# Patient Record
Sex: Male | Born: 1955
Health system: Southern US, Community
[De-identification: ages and names within clinical notes are randomized; demographics above are authoritative.]

## PROBLEM LIST (undated history)

## (undated) DIAGNOSIS — I10 Essential (primary) hypertension: Secondary | ICD-10-CM

## (undated) DIAGNOSIS — E785 Hyperlipidemia, unspecified: Secondary | ICD-10-CM

## (undated) HISTORY — PX: ANKLE SURGERY: SHX546

## (undated) HISTORY — PX: BACK SURGERY: SHX140

## (undated) HISTORY — PX: TOTAL KNEE ARTHROPLASTY: SHX125

---

## 1998-04-25 ENCOUNTER — Inpatient Hospital Stay (HOSPITAL_COMMUNITY): Admission: EM | Admit: 1998-04-25 | Discharge: 1998-05-03 | Payer: Self-pay | Admitting: Psychiatry

## 2001-09-29 ENCOUNTER — Ambulatory Visit (HOSPITAL_BASED_OUTPATIENT_CLINIC_OR_DEPARTMENT_OTHER): Admission: RE | Admit: 2001-09-29 | Discharge: 2001-09-29 | Payer: Self-pay | Admitting: Orthopedic Surgery

## 2003-04-12 ENCOUNTER — Ambulatory Visit (HOSPITAL_BASED_OUTPATIENT_CLINIC_OR_DEPARTMENT_OTHER): Admission: RE | Admit: 2003-04-12 | Discharge: 2003-04-12 | Payer: Self-pay | Admitting: Orthopedic Surgery

## 2003-04-12 ENCOUNTER — Ambulatory Visit (HOSPITAL_COMMUNITY): Admission: RE | Admit: 2003-04-12 | Discharge: 2003-04-12 | Payer: Self-pay | Admitting: Orthopedic Surgery

## 2005-03-05 ENCOUNTER — Inpatient Hospital Stay (HOSPITAL_COMMUNITY): Admission: RE | Admit: 2005-03-05 | Discharge: 2005-03-08 | Payer: Self-pay | Admitting: Orthopedic Surgery

## 2005-04-25 ENCOUNTER — Ambulatory Visit (HOSPITAL_BASED_OUTPATIENT_CLINIC_OR_DEPARTMENT_OTHER): Admission: RE | Admit: 2005-04-25 | Discharge: 2005-04-25 | Payer: Self-pay | Admitting: Orthopedic Surgery

## 2006-03-05 ENCOUNTER — Encounter
Admission: RE | Admit: 2006-03-05 | Discharge: 2006-06-03 | Payer: Self-pay | Admitting: Physical Medicine and Rehabilitation

## 2006-03-05 ENCOUNTER — Ambulatory Visit: Payer: Self-pay | Admitting: Physical Medicine and Rehabilitation

## 2006-04-08 ENCOUNTER — Ambulatory Visit (HOSPITAL_COMMUNITY)
Admission: RE | Admit: 2006-04-08 | Discharge: 2006-04-08 | Payer: Self-pay | Admitting: Physical Medicine and Rehabilitation

## 2011-05-14 DIAGNOSIS — G894 Chronic pain syndrome: Secondary | ICD-10-CM | POA: Diagnosis not present

## 2011-05-14 DIAGNOSIS — N529 Male erectile dysfunction, unspecified: Secondary | ICD-10-CM | POA: Diagnosis not present

## 2011-05-14 DIAGNOSIS — M545 Low back pain, unspecified: Secondary | ICD-10-CM | POA: Diagnosis not present

## 2011-06-03 ENCOUNTER — Emergency Department (HOSPITAL_BASED_OUTPATIENT_CLINIC_OR_DEPARTMENT_OTHER)
Admission: EM | Admit: 2011-06-03 | Discharge: 2011-06-03 | Discharge status: Against Medical Advice | Payer: Medicare Other | Attending: Emergency Medicine | Admitting: Emergency Medicine

## 2011-06-03 ENCOUNTER — Encounter (HOSPITAL_BASED_OUTPATIENT_CLINIC_OR_DEPARTMENT_OTHER): Payer: Self-pay

## 2011-06-03 DIAGNOSIS — M25569 Pain in unspecified knee: Secondary | ICD-10-CM | POA: Diagnosis not present

## 2011-06-03 DIAGNOSIS — M1710 Unilateral primary osteoarthritis, unspecified knee: Secondary | ICD-10-CM | POA: Diagnosis not present

## 2011-06-03 DIAGNOSIS — M25869 Other specified joint disorders, unspecified knee: Secondary | ICD-10-CM | POA: Diagnosis not present

## 2011-06-03 HISTORY — DX: Hyperlipidemia, unspecified: E78.5

## 2011-06-03 HISTORY — DX: Essential (primary) hypertension: I10

## 2011-06-03 NOTE — ED Notes (Signed)
Pt states his knee is feeling better and he is going to follow up with his doctor. K. Sophia, PA advised.

## 2011-06-03 NOTE — ED Notes (Signed)
Pt states that his left knee began hurting this morning.  Pt states that he has had a R knee replacement in the past.  Dr Valentina Gu is his orthopedist.  Pt states that he takes oxycodone and opana for pain.

## 2011-06-06 DIAGNOSIS — M7650 Patellar tendinitis, unspecified knee: Secondary | ICD-10-CM | POA: Diagnosis not present

## 2011-06-06 DIAGNOSIS — M12269 Villonodular synovitis (pigmented), unspecified knee: Secondary | ICD-10-CM | POA: Diagnosis not present

## 2011-06-14 DIAGNOSIS — G894 Chronic pain syndrome: Secondary | ICD-10-CM | POA: Diagnosis not present

## 2011-06-14 DIAGNOSIS — M899 Disorder of bone, unspecified: Secondary | ICD-10-CM | POA: Diagnosis not present

## 2011-06-14 DIAGNOSIS — I10 Essential (primary) hypertension: Secondary | ICD-10-CM | POA: Diagnosis not present

## 2011-06-14 DIAGNOSIS — E291 Testicular hypofunction: Secondary | ICD-10-CM | POA: Diagnosis not present

## 2011-06-14 DIAGNOSIS — Z79899 Other long term (current) drug therapy: Secondary | ICD-10-CM | POA: Diagnosis not present

## 2011-06-14 DIAGNOSIS — M949 Disorder of cartilage, unspecified: Secondary | ICD-10-CM | POA: Diagnosis not present

## 2011-07-10 DIAGNOSIS — M12269 Villonodular synovitis (pigmented), unspecified knee: Secondary | ICD-10-CM | POA: Diagnosis not present

## 2011-07-11 DIAGNOSIS — R0602 Shortness of breath: Secondary | ICD-10-CM | POA: Diagnosis not present

## 2011-07-11 DIAGNOSIS — R1084 Generalized abdominal pain: Secondary | ICD-10-CM | POA: Diagnosis not present

## 2011-07-11 DIAGNOSIS — R1110 Vomiting, unspecified: Secondary | ICD-10-CM | POA: Diagnosis not present

## 2011-07-11 DIAGNOSIS — Z86010 Personal history of colonic polyps: Secondary | ICD-10-CM | POA: Diagnosis not present

## 2011-07-11 DIAGNOSIS — I10 Essential (primary) hypertension: Secondary | ICD-10-CM | POA: Diagnosis not present

## 2011-07-11 DIAGNOSIS — R82998 Other abnormal findings in urine: Secondary | ICD-10-CM | POA: Diagnosis not present

## 2011-07-11 DIAGNOSIS — R1011 Right upper quadrant pain: Secondary | ICD-10-CM | POA: Diagnosis not present

## 2011-07-11 DIAGNOSIS — I498 Other specified cardiac arrhythmias: Secondary | ICD-10-CM | POA: Diagnosis not present

## 2011-07-11 DIAGNOSIS — K2990 Gastroduodenitis, unspecified, without bleeding: Secondary | ICD-10-CM | POA: Diagnosis not present

## 2011-07-11 DIAGNOSIS — K5730 Diverticulosis of large intestine without perforation or abscess without bleeding: Secondary | ICD-10-CM | POA: Diagnosis not present

## 2011-07-11 DIAGNOSIS — R112 Nausea with vomiting, unspecified: Secondary | ICD-10-CM | POA: Diagnosis not present

## 2011-07-11 DIAGNOSIS — R109 Unspecified abdominal pain: Secondary | ICD-10-CM | POA: Diagnosis not present

## 2011-07-12 DIAGNOSIS — E291 Testicular hypofunction: Secondary | ICD-10-CM | POA: Diagnosis not present

## 2011-07-12 DIAGNOSIS — R1110 Vomiting, unspecified: Secondary | ICD-10-CM | POA: Diagnosis not present

## 2011-07-12 DIAGNOSIS — R109 Unspecified abdominal pain: Secondary | ICD-10-CM | POA: Diagnosis not present

## 2011-07-12 DIAGNOSIS — R112 Nausea with vomiting, unspecified: Secondary | ICD-10-CM | POA: Diagnosis not present

## 2011-07-12 DIAGNOSIS — R1011 Right upper quadrant pain: Secondary | ICD-10-CM | POA: Diagnosis not present

## 2011-07-12 DIAGNOSIS — K5730 Diverticulosis of large intestine without perforation or abscess without bleeding: Secondary | ICD-10-CM | POA: Diagnosis not present

## 2011-07-12 DIAGNOSIS — E039 Hypothyroidism, unspecified: Secondary | ICD-10-CM | POA: Diagnosis not present

## 2011-07-12 DIAGNOSIS — R110 Nausea: Secondary | ICD-10-CM | POA: Diagnosis not present

## 2011-07-12 DIAGNOSIS — G894 Chronic pain syndrome: Secondary | ICD-10-CM | POA: Diagnosis not present

## 2011-08-09 DIAGNOSIS — G894 Chronic pain syndrome: Secondary | ICD-10-CM | POA: Diagnosis not present

## 2011-08-09 DIAGNOSIS — E291 Testicular hypofunction: Secondary | ICD-10-CM | POA: Diagnosis not present

## 2011-08-09 DIAGNOSIS — M109 Gout, unspecified: Secondary | ICD-10-CM | POA: Diagnosis not present

## 2011-08-09 DIAGNOSIS — E559 Vitamin D deficiency, unspecified: Secondary | ICD-10-CM | POA: Diagnosis not present

## 2011-08-09 DIAGNOSIS — E782 Mixed hyperlipidemia: Secondary | ICD-10-CM | POA: Diagnosis not present

## 2011-08-09 DIAGNOSIS — E039 Hypothyroidism, unspecified: Secondary | ICD-10-CM | POA: Diagnosis not present

## 2011-08-09 DIAGNOSIS — Z79899 Other long term (current) drug therapy: Secondary | ICD-10-CM | POA: Diagnosis not present

## 2011-09-06 DIAGNOSIS — E291 Testicular hypofunction: Secondary | ICD-10-CM | POA: Diagnosis not present

## 2011-09-06 DIAGNOSIS — G894 Chronic pain syndrome: Secondary | ICD-10-CM | POA: Diagnosis not present

## 2011-09-06 DIAGNOSIS — M109 Gout, unspecified: Secondary | ICD-10-CM | POA: Diagnosis not present

## 2011-09-06 DIAGNOSIS — I10 Essential (primary) hypertension: Secondary | ICD-10-CM | POA: Diagnosis not present

## 2011-09-10 DIAGNOSIS — K5900 Constipation, unspecified: Secondary | ICD-10-CM | POA: Diagnosis not present

## 2011-09-10 DIAGNOSIS — R1084 Generalized abdominal pain: Secondary | ICD-10-CM | POA: Diagnosis not present

## 2011-10-01 DIAGNOSIS — R1084 Generalized abdominal pain: Secondary | ICD-10-CM | POA: Diagnosis not present

## 2011-10-04 DIAGNOSIS — M109 Gout, unspecified: Secondary | ICD-10-CM | POA: Diagnosis not present

## 2011-10-04 DIAGNOSIS — M25519 Pain in unspecified shoulder: Secondary | ICD-10-CM | POA: Diagnosis not present

## 2011-10-04 DIAGNOSIS — IMO0002 Reserved for concepts with insufficient information to code with codable children: Secondary | ICD-10-CM | POA: Diagnosis not present

## 2011-10-04 DIAGNOSIS — G894 Chronic pain syndrome: Secondary | ICD-10-CM | POA: Diagnosis not present

## 2011-10-08 DIAGNOSIS — R1084 Generalized abdominal pain: Secondary | ICD-10-CM | POA: Diagnosis not present

## 2011-10-08 DIAGNOSIS — K219 Gastro-esophageal reflux disease without esophagitis: Secondary | ICD-10-CM | POA: Diagnosis not present

## 2011-10-08 DIAGNOSIS — K5900 Constipation, unspecified: Secondary | ICD-10-CM | POA: Diagnosis not present

## 2011-10-08 DIAGNOSIS — K2270 Barrett's esophagus without dysplasia: Secondary | ICD-10-CM | POA: Diagnosis not present

## 2011-11-02 DIAGNOSIS — I10 Essential (primary) hypertension: Secondary | ICD-10-CM | POA: Diagnosis not present

## 2011-11-02 DIAGNOSIS — Z0000 Encounter for general adult medical examination without abnormal findings: Secondary | ICD-10-CM | POA: Diagnosis not present

## 2011-11-02 DIAGNOSIS — G894 Chronic pain syndrome: Secondary | ICD-10-CM | POA: Diagnosis not present

## 2011-11-30 DIAGNOSIS — M109 Gout, unspecified: Secondary | ICD-10-CM | POA: Diagnosis not present

## 2011-11-30 DIAGNOSIS — G894 Chronic pain syndrome: Secondary | ICD-10-CM | POA: Diagnosis not present

## 2011-11-30 DIAGNOSIS — I10 Essential (primary) hypertension: Secondary | ICD-10-CM | POA: Diagnosis not present

## 2011-12-28 DIAGNOSIS — I10 Essential (primary) hypertension: Secondary | ICD-10-CM | POA: Diagnosis not present

## 2011-12-28 DIAGNOSIS — J309 Allergic rhinitis, unspecified: Secondary | ICD-10-CM | POA: Diagnosis not present

## 2011-12-28 DIAGNOSIS — R0602 Shortness of breath: Secondary | ICD-10-CM | POA: Diagnosis not present

## 2011-12-28 DIAGNOSIS — G894 Chronic pain syndrome: Secondary | ICD-10-CM | POA: Diagnosis not present

## 2012-01-17 DIAGNOSIS — M5137 Other intervertebral disc degeneration, lumbosacral region: Secondary | ICD-10-CM | POA: Diagnosis not present

## 2012-01-17 DIAGNOSIS — M25569 Pain in unspecified knee: Secondary | ICD-10-CM | POA: Diagnosis not present

## 2012-01-17 DIAGNOSIS — IMO0002 Reserved for concepts with insufficient information to code with codable children: Secondary | ICD-10-CM | POA: Diagnosis not present

## 2012-02-21 DIAGNOSIS — M19279 Secondary osteoarthritis, unspecified ankle and foot: Secondary | ICD-10-CM | POA: Diagnosis not present

## 2012-02-21 DIAGNOSIS — M24576 Contracture, unspecified foot: Secondary | ICD-10-CM | POA: Diagnosis not present

## 2012-02-21 DIAGNOSIS — M24573 Contracture, unspecified ankle: Secondary | ICD-10-CM | POA: Diagnosis not present

## 2012-02-21 DIAGNOSIS — M79609 Pain in unspecified limb: Secondary | ICD-10-CM | POA: Diagnosis not present

## 2012-02-22 DIAGNOSIS — M549 Dorsalgia, unspecified: Secondary | ICD-10-CM | POA: Diagnosis not present

## 2012-02-22 DIAGNOSIS — G894 Chronic pain syndrome: Secondary | ICD-10-CM | POA: Diagnosis not present

## 2012-02-22 DIAGNOSIS — E782 Mixed hyperlipidemia: Secondary | ICD-10-CM | POA: Diagnosis not present

## 2012-02-22 DIAGNOSIS — E039 Hypothyroidism, unspecified: Secondary | ICD-10-CM | POA: Diagnosis not present

## 2012-02-22 DIAGNOSIS — E559 Vitamin D deficiency, unspecified: Secondary | ICD-10-CM | POA: Diagnosis not present

## 2012-02-22 DIAGNOSIS — E291 Testicular hypofunction: Secondary | ICD-10-CM | POA: Diagnosis not present

## 2012-02-22 DIAGNOSIS — M109 Gout, unspecified: Secondary | ICD-10-CM | POA: Diagnosis not present

## 2012-04-18 DIAGNOSIS — E291 Testicular hypofunction: Secondary | ICD-10-CM | POA: Diagnosis not present

## 2012-04-18 DIAGNOSIS — G894 Chronic pain syndrome: Secondary | ICD-10-CM | POA: Diagnosis not present

## 2012-04-18 DIAGNOSIS — J309 Allergic rhinitis, unspecified: Secondary | ICD-10-CM | POA: Diagnosis not present

## 2012-04-18 DIAGNOSIS — E559 Vitamin D deficiency, unspecified: Secondary | ICD-10-CM | POA: Diagnosis not present

## 2012-05-14 DIAGNOSIS — I10 Essential (primary) hypertension: Secondary | ICD-10-CM | POA: Diagnosis not present

## 2012-05-14 DIAGNOSIS — E782 Mixed hyperlipidemia: Secondary | ICD-10-CM | POA: Diagnosis not present

## 2012-05-14 DIAGNOSIS — J069 Acute upper respiratory infection, unspecified: Secondary | ICD-10-CM | POA: Diagnosis not present

## 2012-05-14 DIAGNOSIS — R9431 Abnormal electrocardiogram [ECG] [EKG]: Secondary | ICD-10-CM | POA: Diagnosis not present

## 2012-05-16 DIAGNOSIS — R0602 Shortness of breath: Secondary | ICD-10-CM | POA: Diagnosis not present

## 2012-05-16 DIAGNOSIS — G894 Chronic pain syndrome: Secondary | ICD-10-CM | POA: Diagnosis not present

## 2012-05-16 DIAGNOSIS — J309 Allergic rhinitis, unspecified: Secondary | ICD-10-CM | POA: Diagnosis not present

## 2012-05-16 DIAGNOSIS — I10 Essential (primary) hypertension: Secondary | ICD-10-CM | POA: Diagnosis not present

## 2012-06-19 DIAGNOSIS — IMO0002 Reserved for concepts with insufficient information to code with codable children: Secondary | ICD-10-CM | POA: Diagnosis not present

## 2012-06-20 DIAGNOSIS — R0602 Shortness of breath: Secondary | ICD-10-CM | POA: Diagnosis not present

## 2012-06-20 DIAGNOSIS — R079 Chest pain, unspecified: Secondary | ICD-10-CM | POA: Diagnosis not present

## 2012-07-09 DIAGNOSIS — R109 Unspecified abdominal pain: Secondary | ICD-10-CM | POA: Diagnosis not present

## 2012-07-14 DIAGNOSIS — K219 Gastro-esophageal reflux disease without esophagitis: Secondary | ICD-10-CM | POA: Diagnosis not present

## 2012-07-16 DIAGNOSIS — E291 Testicular hypofunction: Secondary | ICD-10-CM | POA: Diagnosis not present

## 2012-07-16 DIAGNOSIS — M779 Enthesopathy, unspecified: Secondary | ICD-10-CM | POA: Diagnosis not present

## 2012-07-16 DIAGNOSIS — G894 Chronic pain syndrome: Secondary | ICD-10-CM | POA: Diagnosis not present

## 2012-07-18 DIAGNOSIS — M7710 Lateral epicondylitis, unspecified elbow: Secondary | ICD-10-CM | POA: Diagnosis not present

## 2012-08-05 DIAGNOSIS — R079 Chest pain, unspecified: Secondary | ICD-10-CM | POA: Diagnosis not present

## 2012-08-05 DIAGNOSIS — E782 Mixed hyperlipidemia: Secondary | ICD-10-CM | POA: Diagnosis not present

## 2012-08-05 DIAGNOSIS — I10 Essential (primary) hypertension: Secondary | ICD-10-CM | POA: Diagnosis not present

## 2012-08-05 DIAGNOSIS — R0602 Shortness of breath: Secondary | ICD-10-CM | POA: Diagnosis not present

## 2012-08-13 DIAGNOSIS — R079 Chest pain, unspecified: Secondary | ICD-10-CM | POA: Diagnosis not present

## 2012-08-13 DIAGNOSIS — R0602 Shortness of breath: Secondary | ICD-10-CM | POA: Diagnosis not present

## 2012-08-14 DIAGNOSIS — E291 Testicular hypofunction: Secondary | ICD-10-CM | POA: Diagnosis not present

## 2012-08-14 DIAGNOSIS — G894 Chronic pain syndrome: Secondary | ICD-10-CM | POA: Diagnosis not present

## 2012-08-14 DIAGNOSIS — M25569 Pain in unspecified knee: Secondary | ICD-10-CM | POA: Diagnosis not present

## 2012-08-14 DIAGNOSIS — M109 Gout, unspecified: Secondary | ICD-10-CM | POA: Diagnosis not present

## 2012-09-16 DIAGNOSIS — Z79899 Other long term (current) drug therapy: Secondary | ICD-10-CM | POA: Diagnosis not present

## 2012-09-16 DIAGNOSIS — I10 Essential (primary) hypertension: Secondary | ICD-10-CM | POA: Diagnosis not present

## 2012-09-16 DIAGNOSIS — IMO0002 Reserved for concepts with insufficient information to code with codable children: Secondary | ICD-10-CM | POA: Diagnosis not present

## 2012-09-16 DIAGNOSIS — G8929 Other chronic pain: Secondary | ICD-10-CM | POA: Diagnosis not present

## 2012-10-15 DIAGNOSIS — G8929 Other chronic pain: Secondary | ICD-10-CM | POA: Diagnosis not present

## 2012-10-15 DIAGNOSIS — I10 Essential (primary) hypertension: Secondary | ICD-10-CM | POA: Diagnosis not present

## 2012-10-15 DIAGNOSIS — E559 Vitamin D deficiency, unspecified: Secondary | ICD-10-CM | POA: Diagnosis not present

## 2012-11-13 DIAGNOSIS — E038 Other specified hypothyroidism: Secondary | ICD-10-CM | POA: Diagnosis not present

## 2012-11-13 DIAGNOSIS — Z79899 Other long term (current) drug therapy: Secondary | ICD-10-CM | POA: Diagnosis not present

## 2012-11-13 DIAGNOSIS — M109 Gout, unspecified: Secondary | ICD-10-CM | POA: Diagnosis not present

## 2012-11-13 DIAGNOSIS — E782 Mixed hyperlipidemia: Secondary | ICD-10-CM | POA: Diagnosis not present

## 2012-11-13 DIAGNOSIS — N529 Male erectile dysfunction, unspecified: Secondary | ICD-10-CM | POA: Diagnosis not present

## 2012-11-13 DIAGNOSIS — R7309 Other abnormal glucose: Secondary | ICD-10-CM | POA: Diagnosis not present

## 2012-11-13 DIAGNOSIS — G8929 Other chronic pain: Secondary | ICD-10-CM | POA: Diagnosis not present

## 2012-11-13 DIAGNOSIS — E559 Vitamin D deficiency, unspecified: Secondary | ICD-10-CM | POA: Diagnosis not present

## 2012-12-11 DIAGNOSIS — I10 Essential (primary) hypertension: Secondary | ICD-10-CM | POA: Diagnosis not present

## 2012-12-11 DIAGNOSIS — E782 Mixed hyperlipidemia: Secondary | ICD-10-CM | POA: Diagnosis not present

## 2012-12-11 DIAGNOSIS — M109 Gout, unspecified: Secondary | ICD-10-CM | POA: Diagnosis not present

## 2012-12-11 DIAGNOSIS — G8929 Other chronic pain: Secondary | ICD-10-CM | POA: Diagnosis not present

## 2012-12-11 DIAGNOSIS — E038 Other specified hypothyroidism: Secondary | ICD-10-CM | POA: Diagnosis not present

## 2012-12-24 DIAGNOSIS — Z86010 Personal history of colonic polyps: Secondary | ICD-10-CM | POA: Diagnosis not present

## 2012-12-24 DIAGNOSIS — R197 Diarrhea, unspecified: Secondary | ICD-10-CM | POA: Diagnosis not present

## 2013-01-08 DIAGNOSIS — G8929 Other chronic pain: Secondary | ICD-10-CM | POA: Diagnosis not present

## 2013-01-08 DIAGNOSIS — E291 Testicular hypofunction: Secondary | ICD-10-CM | POA: Diagnosis not present

## 2013-01-08 DIAGNOSIS — K219 Gastro-esophageal reflux disease without esophagitis: Secondary | ICD-10-CM | POA: Diagnosis not present

## 2013-01-08 DIAGNOSIS — I10 Essential (primary) hypertension: Secondary | ICD-10-CM | POA: Diagnosis not present

## 2013-01-08 DIAGNOSIS — Z6833 Body mass index (BMI) 33.0-33.9, adult: Secondary | ICD-10-CM | POA: Diagnosis not present

## 2013-01-28 DIAGNOSIS — Z86010 Personal history of colonic polyps: Secondary | ICD-10-CM | POA: Diagnosis not present

## 2013-01-28 DIAGNOSIS — R110 Nausea: Secondary | ICD-10-CM | POA: Diagnosis not present

## 2013-02-05 DIAGNOSIS — G8929 Other chronic pain: Secondary | ICD-10-CM | POA: Diagnosis not present

## 2013-02-05 DIAGNOSIS — I10 Essential (primary) hypertension: Secondary | ICD-10-CM | POA: Diagnosis not present

## 2013-02-05 DIAGNOSIS — M5380 Other specified dorsopathies, site unspecified: Secondary | ICD-10-CM | POA: Diagnosis not present

## 2013-02-25 DIAGNOSIS — IMO0002 Reserved for concepts with insufficient information to code with codable children: Secondary | ICD-10-CM | POA: Diagnosis not present

## 2013-02-25 DIAGNOSIS — T84069A Wear of articular bearing surface of unspecified internal prosthetic joint, initial encounter: Secondary | ICD-10-CM | POA: Diagnosis not present

## 2013-02-25 DIAGNOSIS — Z96659 Presence of unspecified artificial knee joint: Secondary | ICD-10-CM | POA: Diagnosis not present

## 2013-02-27 DIAGNOSIS — M47817 Spondylosis without myelopathy or radiculopathy, lumbosacral region: Secondary | ICD-10-CM | POA: Diagnosis not present

## 2013-02-27 DIAGNOSIS — M5137 Other intervertebral disc degeneration, lumbosacral region: Secondary | ICD-10-CM | POA: Diagnosis not present

## 2013-02-27 DIAGNOSIS — M545 Low back pain, unspecified: Secondary | ICD-10-CM | POA: Diagnosis not present

## 2013-02-27 DIAGNOSIS — M5126 Other intervertebral disc displacement, lumbar region: Secondary | ICD-10-CM | POA: Diagnosis not present

## 2013-03-06 DIAGNOSIS — E782 Mixed hyperlipidemia: Secondary | ICD-10-CM | POA: Diagnosis not present

## 2013-03-06 DIAGNOSIS — Z6832 Body mass index (BMI) 32.0-32.9, adult: Secondary | ICD-10-CM | POA: Diagnosis not present

## 2013-03-06 DIAGNOSIS — G8929 Other chronic pain: Secondary | ICD-10-CM | POA: Diagnosis not present

## 2013-03-06 DIAGNOSIS — I10 Essential (primary) hypertension: Secondary | ICD-10-CM | POA: Diagnosis not present

## 2013-04-02 DIAGNOSIS — Z96659 Presence of unspecified artificial knee joint: Secondary | ICD-10-CM | POA: Diagnosis not present

## 2013-04-02 DIAGNOSIS — T84069A Wear of articular bearing surface of unspecified internal prosthetic joint, initial encounter: Secondary | ICD-10-CM | POA: Diagnosis not present

## 2013-04-02 DIAGNOSIS — M5137 Other intervertebral disc degeneration, lumbosacral region: Secondary | ICD-10-CM | POA: Diagnosis not present

## 2013-04-02 DIAGNOSIS — IMO0002 Reserved for concepts with insufficient information to code with codable children: Secondary | ICD-10-CM | POA: Diagnosis not present

## 2013-04-06 DIAGNOSIS — M109 Gout, unspecified: Secondary | ICD-10-CM | POA: Diagnosis not present

## 2013-04-06 DIAGNOSIS — E782 Mixed hyperlipidemia: Secondary | ICD-10-CM | POA: Diagnosis not present

## 2013-04-06 DIAGNOSIS — G8929 Other chronic pain: Secondary | ICD-10-CM | POA: Diagnosis not present

## 2013-04-09 DIAGNOSIS — M545 Low back pain: Secondary | ICD-10-CM | POA: Diagnosis not present

## 2013-04-09 DIAGNOSIS — M961 Postlaminectomy syndrome, not elsewhere classified: Secondary | ICD-10-CM | POA: Diagnosis not present

## 2013-04-09 DIAGNOSIS — M5137 Other intervertebral disc degeneration, lumbosacral region: Secondary | ICD-10-CM | POA: Diagnosis not present

## 2013-04-15 DIAGNOSIS — M549 Dorsalgia, unspecified: Secondary | ICD-10-CM | POA: Diagnosis not present

## 2013-04-15 DIAGNOSIS — IMO0002 Reserved for concepts with insufficient information to code with codable children: Secondary | ICD-10-CM | POA: Diagnosis not present

## 2013-04-29 DIAGNOSIS — M25559 Pain in unspecified hip: Secondary | ICD-10-CM | POA: Diagnosis not present

## 2013-04-29 DIAGNOSIS — M5137 Other intervertebral disc degeneration, lumbosacral region: Secondary | ICD-10-CM | POA: Diagnosis not present

## 2013-04-29 DIAGNOSIS — M961 Postlaminectomy syndrome, not elsewhere classified: Secondary | ICD-10-CM | POA: Diagnosis not present

## 2013-04-29 DIAGNOSIS — M545 Low back pain, unspecified: Secondary | ICD-10-CM | POA: Diagnosis not present

## 2013-05-06 DIAGNOSIS — M109 Gout, unspecified: Secondary | ICD-10-CM | POA: Diagnosis not present

## 2013-05-06 DIAGNOSIS — I10 Essential (primary) hypertension: Secondary | ICD-10-CM | POA: Diagnosis not present

## 2013-05-06 DIAGNOSIS — G8929 Other chronic pain: Secondary | ICD-10-CM | POA: Diagnosis not present

## 2013-05-06 DIAGNOSIS — Z79899 Other long term (current) drug therapy: Secondary | ICD-10-CM | POA: Diagnosis not present

## 2013-05-06 DIAGNOSIS — E291 Testicular hypofunction: Secondary | ICD-10-CM | POA: Diagnosis not present

## 2013-05-06 DIAGNOSIS — E559 Vitamin D deficiency, unspecified: Secondary | ICD-10-CM | POA: Diagnosis not present

## 2013-05-06 DIAGNOSIS — E782 Mixed hyperlipidemia: Secondary | ICD-10-CM | POA: Diagnosis not present

## 2013-05-06 DIAGNOSIS — Z125 Encounter for screening for malignant neoplasm of prostate: Secondary | ICD-10-CM | POA: Diagnosis not present

## 2013-05-06 DIAGNOSIS — R7309 Other abnormal glucose: Secondary | ICD-10-CM | POA: Diagnosis not present

## 2013-05-07 DIAGNOSIS — M545 Low back pain, unspecified: Secondary | ICD-10-CM | POA: Diagnosis not present

## 2013-05-07 DIAGNOSIS — M5137 Other intervertebral disc degeneration, lumbosacral region: Secondary | ICD-10-CM | POA: Diagnosis not present

## 2013-05-07 DIAGNOSIS — M6281 Muscle weakness (generalized): Secondary | ICD-10-CM | POA: Diagnosis not present

## 2013-05-13 DIAGNOSIS — M545 Low back pain, unspecified: Secondary | ICD-10-CM | POA: Diagnosis not present

## 2013-05-13 DIAGNOSIS — M5137 Other intervertebral disc degeneration, lumbosacral region: Secondary | ICD-10-CM | POA: Diagnosis not present

## 2013-05-13 DIAGNOSIS — M6281 Muscle weakness (generalized): Secondary | ICD-10-CM | POA: Diagnosis not present

## 2013-05-14 DIAGNOSIS — M5137 Other intervertebral disc degeneration, lumbosacral region: Secondary | ICD-10-CM | POA: Diagnosis not present

## 2013-05-14 DIAGNOSIS — M6281 Muscle weakness (generalized): Secondary | ICD-10-CM | POA: Diagnosis not present

## 2013-05-14 DIAGNOSIS — M545 Low back pain, unspecified: Secondary | ICD-10-CM | POA: Diagnosis not present

## 2013-05-22 DIAGNOSIS — M5137 Other intervertebral disc degeneration, lumbosacral region: Secondary | ICD-10-CM | POA: Diagnosis not present

## 2013-05-22 DIAGNOSIS — M6281 Muscle weakness (generalized): Secondary | ICD-10-CM | POA: Diagnosis not present

## 2013-05-22 DIAGNOSIS — M545 Low back pain, unspecified: Secondary | ICD-10-CM | POA: Diagnosis not present

## 2013-05-26 DIAGNOSIS — M6281 Muscle weakness (generalized): Secondary | ICD-10-CM | POA: Diagnosis not present

## 2013-05-26 DIAGNOSIS — M545 Low back pain, unspecified: Secondary | ICD-10-CM | POA: Diagnosis not present

## 2013-05-26 DIAGNOSIS — M5137 Other intervertebral disc degeneration, lumbosacral region: Secondary | ICD-10-CM | POA: Diagnosis not present

## 2013-06-02 DIAGNOSIS — M5137 Other intervertebral disc degeneration, lumbosacral region: Secondary | ICD-10-CM | POA: Diagnosis not present

## 2013-06-02 DIAGNOSIS — M545 Low back pain, unspecified: Secondary | ICD-10-CM | POA: Diagnosis not present

## 2013-06-02 DIAGNOSIS — M6281 Muscle weakness (generalized): Secondary | ICD-10-CM | POA: Diagnosis not present

## 2013-06-05 DIAGNOSIS — G8929 Other chronic pain: Secondary | ICD-10-CM | POA: Diagnosis not present

## 2013-06-05 DIAGNOSIS — M109 Gout, unspecified: Secondary | ICD-10-CM | POA: Diagnosis not present

## 2013-06-05 DIAGNOSIS — I10 Essential (primary) hypertension: Secondary | ICD-10-CM | POA: Diagnosis not present

## 2013-06-10 DIAGNOSIS — M545 Low back pain, unspecified: Secondary | ICD-10-CM | POA: Diagnosis not present

## 2013-06-10 DIAGNOSIS — M25559 Pain in unspecified hip: Secondary | ICD-10-CM | POA: Diagnosis not present

## 2013-06-10 DIAGNOSIS — M961 Postlaminectomy syndrome, not elsewhere classified: Secondary | ICD-10-CM | POA: Diagnosis not present

## 2013-06-10 DIAGNOSIS — M5137 Other intervertebral disc degeneration, lumbosacral region: Secondary | ICD-10-CM | POA: Diagnosis not present

## 2013-06-11 DIAGNOSIS — M5137 Other intervertebral disc degeneration, lumbosacral region: Secondary | ICD-10-CM | POA: Diagnosis not present

## 2013-06-11 DIAGNOSIS — M6281 Muscle weakness (generalized): Secondary | ICD-10-CM | POA: Diagnosis not present

## 2013-06-11 DIAGNOSIS — M545 Low back pain, unspecified: Secondary | ICD-10-CM | POA: Diagnosis not present

## 2013-06-16 DIAGNOSIS — M545 Low back pain, unspecified: Secondary | ICD-10-CM | POA: Diagnosis not present

## 2013-06-16 DIAGNOSIS — M6281 Muscle weakness (generalized): Secondary | ICD-10-CM | POA: Diagnosis not present

## 2013-06-16 DIAGNOSIS — M5137 Other intervertebral disc degeneration, lumbosacral region: Secondary | ICD-10-CM | POA: Diagnosis not present

## 2013-06-23 DIAGNOSIS — M6281 Muscle weakness (generalized): Secondary | ICD-10-CM | POA: Diagnosis not present

## 2013-06-23 DIAGNOSIS — M5137 Other intervertebral disc degeneration, lumbosacral region: Secondary | ICD-10-CM | POA: Diagnosis not present

## 2013-06-23 DIAGNOSIS — M545 Low back pain, unspecified: Secondary | ICD-10-CM | POA: Diagnosis not present

## 2013-07-01 DIAGNOSIS — M545 Low back pain, unspecified: Secondary | ICD-10-CM | POA: Diagnosis not present

## 2013-07-01 DIAGNOSIS — M5137 Other intervertebral disc degeneration, lumbosacral region: Secondary | ICD-10-CM | POA: Diagnosis not present

## 2013-07-01 DIAGNOSIS — M6281 Muscle weakness (generalized): Secondary | ICD-10-CM | POA: Diagnosis not present

## 2013-07-02 DIAGNOSIS — G8929 Other chronic pain: Secondary | ICD-10-CM | POA: Diagnosis not present

## 2013-07-02 DIAGNOSIS — I10 Essential (primary) hypertension: Secondary | ICD-10-CM | POA: Diagnosis not present

## 2013-07-09 DIAGNOSIS — M545 Low back pain, unspecified: Secondary | ICD-10-CM | POA: Diagnosis not present

## 2013-07-09 DIAGNOSIS — M5137 Other intervertebral disc degeneration, lumbosacral region: Secondary | ICD-10-CM | POA: Diagnosis not present

## 2013-07-09 DIAGNOSIS — M6281 Muscle weakness (generalized): Secondary | ICD-10-CM | POA: Diagnosis not present

## 2013-07-14 DIAGNOSIS — M545 Low back pain, unspecified: Secondary | ICD-10-CM | POA: Diagnosis not present

## 2013-07-14 DIAGNOSIS — M6281 Muscle weakness (generalized): Secondary | ICD-10-CM | POA: Diagnosis not present

## 2013-07-14 DIAGNOSIS — M5137 Other intervertebral disc degeneration, lumbosacral region: Secondary | ICD-10-CM | POA: Diagnosis not present

## 2013-08-03 DIAGNOSIS — G8929 Other chronic pain: Secondary | ICD-10-CM | POA: Diagnosis not present

## 2013-08-03 DIAGNOSIS — I10 Essential (primary) hypertension: Secondary | ICD-10-CM | POA: Diagnosis not present

## 2013-09-01 DIAGNOSIS — I10 Essential (primary) hypertension: Secondary | ICD-10-CM | POA: Diagnosis not present

## 2013-09-01 DIAGNOSIS — E782 Mixed hyperlipidemia: Secondary | ICD-10-CM | POA: Diagnosis not present

## 2013-09-01 DIAGNOSIS — G8929 Other chronic pain: Secondary | ICD-10-CM | POA: Diagnosis not present

## 2013-09-01 DIAGNOSIS — H1010 Acute atopic conjunctivitis, unspecified eye: Secondary | ICD-10-CM | POA: Diagnosis not present

## 2013-10-02 DIAGNOSIS — Z7251 High risk heterosexual behavior: Secondary | ICD-10-CM | POA: Diagnosis not present

## 2013-10-02 DIAGNOSIS — E291 Testicular hypofunction: Secondary | ICD-10-CM | POA: Diagnosis not present

## 2013-10-02 DIAGNOSIS — E559 Vitamin D deficiency, unspecified: Secondary | ICD-10-CM | POA: Diagnosis not present

## 2013-10-02 DIAGNOSIS — K2270 Barrett's esophagus without dysplasia: Secondary | ICD-10-CM | POA: Diagnosis not present

## 2013-10-02 DIAGNOSIS — R7309 Other abnormal glucose: Secondary | ICD-10-CM | POA: Diagnosis not present

## 2013-10-02 DIAGNOSIS — M109 Gout, unspecified: Secondary | ICD-10-CM | POA: Diagnosis not present

## 2013-10-02 DIAGNOSIS — G8929 Other chronic pain: Secondary | ICD-10-CM | POA: Diagnosis not present

## 2013-10-02 DIAGNOSIS — Z79899 Other long term (current) drug therapy: Secondary | ICD-10-CM | POA: Diagnosis not present

## 2013-10-02 DIAGNOSIS — E782 Mixed hyperlipidemia: Secondary | ICD-10-CM | POA: Diagnosis not present

## 2013-11-02 DIAGNOSIS — E559 Vitamin D deficiency, unspecified: Secondary | ICD-10-CM | POA: Diagnosis not present

## 2013-11-02 DIAGNOSIS — M109 Gout, unspecified: Secondary | ICD-10-CM | POA: Diagnosis not present

## 2013-11-02 DIAGNOSIS — G8929 Other chronic pain: Secondary | ICD-10-CM | POA: Diagnosis not present

## 2013-11-02 DIAGNOSIS — B009 Herpesviral infection, unspecified: Secondary | ICD-10-CM | POA: Diagnosis not present

## 2013-11-02 DIAGNOSIS — E291 Testicular hypofunction: Secondary | ICD-10-CM | POA: Diagnosis not present

## 2013-12-02 DIAGNOSIS — I10 Essential (primary) hypertension: Secondary | ICD-10-CM | POA: Diagnosis not present

## 2013-12-02 DIAGNOSIS — G8929 Other chronic pain: Secondary | ICD-10-CM | POA: Diagnosis not present

## 2013-12-02 DIAGNOSIS — IMO0002 Reserved for concepts with insufficient information to code with codable children: Secondary | ICD-10-CM | POA: Diagnosis not present

## 2013-12-15 DIAGNOSIS — R109 Unspecified abdominal pain: Secondary | ICD-10-CM | POA: Diagnosis not present

## 2013-12-15 DIAGNOSIS — K2270 Barrett's esophagus without dysplasia: Secondary | ICD-10-CM | POA: Diagnosis not present

## 2013-12-15 DIAGNOSIS — K5900 Constipation, unspecified: Secondary | ICD-10-CM | POA: Diagnosis not present

## 2013-12-15 DIAGNOSIS — Z86010 Personal history of colonic polyps: Secondary | ICD-10-CM | POA: Diagnosis not present

## 2014-01-07 DIAGNOSIS — E291 Testicular hypofunction: Secondary | ICD-10-CM | POA: Diagnosis not present

## 2014-01-07 DIAGNOSIS — N979 Female infertility, unspecified: Secondary | ICD-10-CM | POA: Diagnosis not present

## 2014-01-07 DIAGNOSIS — E559 Vitamin D deficiency, unspecified: Secondary | ICD-10-CM | POA: Diagnosis not present

## 2014-01-07 DIAGNOSIS — I10 Essential (primary) hypertension: Secondary | ICD-10-CM | POA: Diagnosis not present

## 2014-01-07 DIAGNOSIS — E782 Mixed hyperlipidemia: Secondary | ICD-10-CM | POA: Diagnosis not present

## 2014-01-07 DIAGNOSIS — G894 Chronic pain syndrome: Secondary | ICD-10-CM | POA: Diagnosis not present

## 2014-01-14 DIAGNOSIS — K2270 Barrett's esophagus without dysplasia: Secondary | ICD-10-CM | POA: Diagnosis not present

## 2014-01-14 DIAGNOSIS — D126 Benign neoplasm of colon, unspecified: Secondary | ICD-10-CM | POA: Diagnosis not present

## 2014-01-14 DIAGNOSIS — Z01818 Encounter for other preprocedural examination: Secondary | ICD-10-CM | POA: Diagnosis not present

## 2014-01-14 DIAGNOSIS — A048 Other specified bacterial intestinal infections: Secondary | ICD-10-CM | POA: Diagnosis not present

## 2014-01-18 DIAGNOSIS — D126 Benign neoplasm of colon, unspecified: Secondary | ICD-10-CM | POA: Diagnosis not present

## 2014-01-19 DIAGNOSIS — K2940 Chronic atrophic gastritis without bleeding: Secondary | ICD-10-CM | POA: Diagnosis not present

## 2014-01-19 DIAGNOSIS — K2270 Barrett's esophagus without dysplasia: Secondary | ICD-10-CM | POA: Diagnosis not present

## 2014-01-19 DIAGNOSIS — K5281 Eosinophilic gastritis or gastroenteritis: Secondary | ICD-10-CM | POA: Diagnosis not present

## 2014-01-19 DIAGNOSIS — A048 Other specified bacterial intestinal infections: Secondary | ICD-10-CM | POA: Diagnosis not present

## 2014-01-20 DIAGNOSIS — K200 Eosinophilic esophagitis: Secondary | ICD-10-CM | POA: Diagnosis not present

## 2014-01-21 DIAGNOSIS — K219 Gastro-esophageal reflux disease without esophagitis: Secondary | ICD-10-CM | POA: Diagnosis not present

## 2014-01-21 DIAGNOSIS — K200 Eosinophilic esophagitis: Secondary | ICD-10-CM | POA: Diagnosis not present

## 2014-02-03 DIAGNOSIS — E782 Mixed hyperlipidemia: Secondary | ICD-10-CM | POA: Diagnosis not present

## 2014-02-03 DIAGNOSIS — E291 Testicular hypofunction: Secondary | ICD-10-CM | POA: Diagnosis not present

## 2014-02-03 DIAGNOSIS — R739 Hyperglycemia, unspecified: Secondary | ICD-10-CM | POA: Diagnosis not present

## 2014-02-03 DIAGNOSIS — E559 Vitamin D deficiency, unspecified: Secondary | ICD-10-CM | POA: Diagnosis not present

## 2014-02-03 DIAGNOSIS — M1A20X1 Drug-induced chronic gout, unspecified site, with tophus (tophi): Secondary | ICD-10-CM | POA: Diagnosis not present

## 2014-02-03 DIAGNOSIS — G894 Chronic pain syndrome: Secondary | ICD-10-CM | POA: Diagnosis not present

## 2014-02-03 DIAGNOSIS — K2270 Barrett's esophagus without dysplasia: Secondary | ICD-10-CM | POA: Diagnosis not present

## 2014-02-03 DIAGNOSIS — Z79899 Other long term (current) drug therapy: Secondary | ICD-10-CM | POA: Diagnosis not present

## 2014-02-03 DIAGNOSIS — K5909 Other constipation: Secondary | ICD-10-CM | POA: Diagnosis not present

## 2014-02-03 DIAGNOSIS — K219 Gastro-esophageal reflux disease without esophagitis: Secondary | ICD-10-CM | POA: Diagnosis not present

## 2014-02-03 DIAGNOSIS — Z23 Encounter for immunization: Secondary | ICD-10-CM | POA: Diagnosis not present

## 2014-02-03 DIAGNOSIS — E039 Hypothyroidism, unspecified: Secondary | ICD-10-CM | POA: Diagnosis not present

## 2014-02-03 DIAGNOSIS — K200 Eosinophilic esophagitis: Secondary | ICD-10-CM | POA: Diagnosis not present

## 2014-03-05 DIAGNOSIS — E559 Vitamin D deficiency, unspecified: Secondary | ICD-10-CM | POA: Diagnosis not present

## 2014-03-05 DIAGNOSIS — J309 Allergic rhinitis, unspecified: Secondary | ICD-10-CM | POA: Diagnosis not present

## 2014-03-05 DIAGNOSIS — M1A20X1 Drug-induced chronic gout, unspecified site, with tophus (tophi): Secondary | ICD-10-CM | POA: Diagnosis not present

## 2014-03-05 DIAGNOSIS — E039 Hypothyroidism, unspecified: Secondary | ICD-10-CM | POA: Diagnosis not present

## 2014-03-05 DIAGNOSIS — E291 Testicular hypofunction: Secondary | ICD-10-CM | POA: Diagnosis not present

## 2014-03-05 DIAGNOSIS — Z79899 Other long term (current) drug therapy: Secondary | ICD-10-CM | POA: Diagnosis not present

## 2014-03-05 DIAGNOSIS — M5137 Other intervertebral disc degeneration, lumbosacral region: Secondary | ICD-10-CM | POA: Diagnosis not present

## 2014-03-05 DIAGNOSIS — I10 Essential (primary) hypertension: Secondary | ICD-10-CM | POA: Diagnosis not present

## 2014-03-26 DIAGNOSIS — T402X5A Adverse effect of other opioids, initial encounter: Secondary | ICD-10-CM | POA: Diagnosis not present

## 2014-03-26 DIAGNOSIS — K5909 Other constipation: Secondary | ICD-10-CM | POA: Diagnosis not present

## 2014-03-26 DIAGNOSIS — K200 Eosinophilic esophagitis: Secondary | ICD-10-CM | POA: Diagnosis not present

## 2014-04-02 DIAGNOSIS — M5137 Other intervertebral disc degeneration, lumbosacral region: Secondary | ICD-10-CM | POA: Diagnosis not present

## 2014-04-02 DIAGNOSIS — Z6833 Body mass index (BMI) 33.0-33.9, adult: Secondary | ICD-10-CM | POA: Diagnosis not present

## 2014-04-02 DIAGNOSIS — Z79899 Other long term (current) drug therapy: Secondary | ICD-10-CM | POA: Diagnosis not present

## 2014-04-02 DIAGNOSIS — I10 Essential (primary) hypertension: Secondary | ICD-10-CM | POA: Diagnosis not present

## 2014-04-29 DIAGNOSIS — E559 Vitamin D deficiency, unspecified: Secondary | ICD-10-CM | POA: Diagnosis not present

## 2014-04-29 DIAGNOSIS — Z79899 Other long term (current) drug therapy: Secondary | ICD-10-CM | POA: Diagnosis not present

## 2014-04-29 DIAGNOSIS — E291 Testicular hypofunction: Secondary | ICD-10-CM | POA: Diagnosis not present

## 2014-04-29 DIAGNOSIS — E782 Mixed hyperlipidemia: Secondary | ICD-10-CM | POA: Diagnosis not present

## 2014-04-29 DIAGNOSIS — E039 Hypothyroidism, unspecified: Secondary | ICD-10-CM | POA: Diagnosis not present

## 2014-04-29 DIAGNOSIS — M1A20X1 Drug-induced chronic gout, unspecified site, with tophus (tophi): Secondary | ICD-10-CM | POA: Diagnosis not present

## 2014-04-29 DIAGNOSIS — R739 Hyperglycemia, unspecified: Secondary | ICD-10-CM | POA: Diagnosis not present

## 2014-04-29 DIAGNOSIS — M5137 Other intervertebral disc degeneration, lumbosacral region: Secondary | ICD-10-CM | POA: Diagnosis not present

## 2014-06-01 DIAGNOSIS — M1A20X1 Drug-induced chronic gout, unspecified site, with tophus (tophi): Secondary | ICD-10-CM | POA: Diagnosis not present

## 2014-06-01 DIAGNOSIS — Z79899 Other long term (current) drug therapy: Secondary | ICD-10-CM | POA: Diagnosis not present

## 2014-06-01 DIAGNOSIS — E291 Testicular hypofunction: Secondary | ICD-10-CM | POA: Diagnosis not present

## 2014-06-01 DIAGNOSIS — E782 Mixed hyperlipidemia: Secondary | ICD-10-CM | POA: Diagnosis not present

## 2014-06-01 DIAGNOSIS — E559 Vitamin D deficiency, unspecified: Secondary | ICD-10-CM | POA: Diagnosis not present

## 2014-06-01 DIAGNOSIS — E039 Hypothyroidism, unspecified: Secondary | ICD-10-CM | POA: Diagnosis not present

## 2014-06-01 DIAGNOSIS — G894 Chronic pain syndrome: Secondary | ICD-10-CM | POA: Diagnosis not present

## 2014-06-01 DIAGNOSIS — M5137 Other intervertebral disc degeneration, lumbosacral region: Secondary | ICD-10-CM | POA: Diagnosis not present

## 2014-06-29 DIAGNOSIS — K200 Eosinophilic esophagitis: Secondary | ICD-10-CM | POA: Diagnosis not present

## 2014-06-29 DIAGNOSIS — K219 Gastro-esophageal reflux disease without esophagitis: Secondary | ICD-10-CM | POA: Diagnosis not present

## 2014-06-29 DIAGNOSIS — R1013 Epigastric pain: Secondary | ICD-10-CM | POA: Diagnosis not present

## 2014-06-29 DIAGNOSIS — K2270 Barrett's esophagus without dysplasia: Secondary | ICD-10-CM | POA: Diagnosis not present

## 2014-06-30 DIAGNOSIS — K5900 Constipation, unspecified: Secondary | ICD-10-CM | POA: Diagnosis not present

## 2014-06-30 DIAGNOSIS — K219 Gastro-esophageal reflux disease without esophagitis: Secondary | ICD-10-CM | POA: Diagnosis not present

## 2014-06-30 DIAGNOSIS — M5137 Other intervertebral disc degeneration, lumbosacral region: Secondary | ICD-10-CM | POA: Diagnosis not present

## 2014-06-30 DIAGNOSIS — G894 Chronic pain syndrome: Secondary | ICD-10-CM | POA: Diagnosis not present

## 2014-06-30 DIAGNOSIS — Z6832 Body mass index (BMI) 32.0-32.9, adult: Secondary | ICD-10-CM | POA: Diagnosis not present

## 2014-06-30 DIAGNOSIS — E559 Vitamin D deficiency, unspecified: Secondary | ICD-10-CM | POA: Diagnosis not present

## 2014-07-27 DIAGNOSIS — Z01818 Encounter for other preprocedural examination: Secondary | ICD-10-CM | POA: Diagnosis not present

## 2014-07-27 DIAGNOSIS — A048 Other specified bacterial intestinal infections: Secondary | ICD-10-CM | POA: Diagnosis not present

## 2014-07-27 DIAGNOSIS — K200 Eosinophilic esophagitis: Secondary | ICD-10-CM | POA: Diagnosis not present

## 2014-08-02 DIAGNOSIS — K200 Eosinophilic esophagitis: Secondary | ICD-10-CM | POA: Diagnosis not present

## 2014-08-02 DIAGNOSIS — A048 Other specified bacterial intestinal infections: Secondary | ICD-10-CM | POA: Diagnosis not present

## 2014-08-02 DIAGNOSIS — K5281 Eosinophilic gastritis or gastroenteritis: Secondary | ICD-10-CM | POA: Diagnosis not present

## 2014-08-02 DIAGNOSIS — M5137 Other intervertebral disc degeneration, lumbosacral region: Secondary | ICD-10-CM | POA: Diagnosis not present

## 2014-08-02 DIAGNOSIS — E782 Mixed hyperlipidemia: Secondary | ICD-10-CM | POA: Diagnosis not present

## 2014-08-02 DIAGNOSIS — Z6833 Body mass index (BMI) 33.0-33.9, adult: Secondary | ICD-10-CM | POA: Diagnosis not present

## 2014-08-02 DIAGNOSIS — K2950 Unspecified chronic gastritis without bleeding: Secondary | ICD-10-CM | POA: Diagnosis not present

## 2014-08-02 DIAGNOSIS — Z79899 Other long term (current) drug therapy: Secondary | ICD-10-CM | POA: Diagnosis not present

## 2014-08-02 DIAGNOSIS — H1010 Acute atopic conjunctivitis, unspecified eye: Secondary | ICD-10-CM | POA: Diagnosis not present

## 2014-09-02 DIAGNOSIS — Z1389 Encounter for screening for other disorder: Secondary | ICD-10-CM | POA: Diagnosis not present

## 2014-09-02 DIAGNOSIS — E039 Hypothyroidism, unspecified: Secondary | ICD-10-CM | POA: Diagnosis not present

## 2014-09-02 DIAGNOSIS — G894 Chronic pain syndrome: Secondary | ICD-10-CM | POA: Diagnosis not present

## 2014-09-02 DIAGNOSIS — E559 Vitamin D deficiency, unspecified: Secondary | ICD-10-CM | POA: Diagnosis not present

## 2014-09-02 DIAGNOSIS — Z6832 Body mass index (BMI) 32.0-32.9, adult: Secondary | ICD-10-CM | POA: Diagnosis not present

## 2014-09-02 DIAGNOSIS — M1A20X1 Drug-induced chronic gout, unspecified site, with tophus (tophi): Secondary | ICD-10-CM | POA: Diagnosis not present

## 2014-09-02 DIAGNOSIS — Z79899 Other long term (current) drug therapy: Secondary | ICD-10-CM | POA: Diagnosis not present

## 2014-09-02 DIAGNOSIS — E291 Testicular hypofunction: Secondary | ICD-10-CM | POA: Diagnosis not present

## 2014-09-02 DIAGNOSIS — R739 Hyperglycemia, unspecified: Secondary | ICD-10-CM | POA: Diagnosis not present

## 2014-09-02 DIAGNOSIS — E782 Mixed hyperlipidemia: Secondary | ICD-10-CM | POA: Diagnosis not present

## 2014-09-29 DIAGNOSIS — Z6832 Body mass index (BMI) 32.0-32.9, adult: Secondary | ICD-10-CM | POA: Diagnosis not present

## 2014-09-29 DIAGNOSIS — E559 Vitamin D deficiency, unspecified: Secondary | ICD-10-CM | POA: Diagnosis not present

## 2014-09-29 DIAGNOSIS — Z1389 Encounter for screening for other disorder: Secondary | ICD-10-CM | POA: Diagnosis not present

## 2014-09-29 DIAGNOSIS — L299 Pruritus, unspecified: Secondary | ICD-10-CM | POA: Diagnosis not present

## 2014-09-29 DIAGNOSIS — G894 Chronic pain syndrome: Secondary | ICD-10-CM | POA: Diagnosis not present

## 2014-09-29 DIAGNOSIS — M5137 Other intervertebral disc degeneration, lumbosacral region: Secondary | ICD-10-CM | POA: Diagnosis not present

## 2014-09-29 DIAGNOSIS — Z79899 Other long term (current) drug therapy: Secondary | ICD-10-CM | POA: Diagnosis not present

## 2014-10-28 DIAGNOSIS — G629 Polyneuropathy, unspecified: Secondary | ICD-10-CM | POA: Diagnosis not present

## 2014-10-28 DIAGNOSIS — Z79899 Other long term (current) drug therapy: Secondary | ICD-10-CM | POA: Diagnosis not present

## 2014-10-28 DIAGNOSIS — M5137 Other intervertebral disc degeneration, lumbosacral region: Secondary | ICD-10-CM | POA: Diagnosis not present

## 2014-10-28 DIAGNOSIS — M62838 Other muscle spasm: Secondary | ICD-10-CM | POA: Diagnosis not present

## 2014-10-28 DIAGNOSIS — Z6832 Body mass index (BMI) 32.0-32.9, adult: Secondary | ICD-10-CM | POA: Diagnosis not present

## 2014-10-28 DIAGNOSIS — I10 Essential (primary) hypertension: Secondary | ICD-10-CM | POA: Diagnosis not present

## 2014-10-29 DIAGNOSIS — M5136 Other intervertebral disc degeneration, lumbar region: Secondary | ICD-10-CM | POA: Diagnosis not present

## 2014-10-29 DIAGNOSIS — M545 Low back pain: Secondary | ICD-10-CM | POA: Diagnosis not present

## 2014-10-29 DIAGNOSIS — Z6831 Body mass index (BMI) 31.0-31.9, adult: Secondary | ICD-10-CM | POA: Diagnosis not present

## 2014-10-29 DIAGNOSIS — M47896 Other spondylosis, lumbar region: Secondary | ICD-10-CM | POA: Diagnosis not present

## 2014-11-08 DIAGNOSIS — N281 Cyst of kidney, acquired: Secondary | ICD-10-CM | POA: Diagnosis not present

## 2014-11-08 DIAGNOSIS — M5136 Other intervertebral disc degeneration, lumbar region: Secondary | ICD-10-CM | POA: Diagnosis not present

## 2014-11-08 DIAGNOSIS — M1990 Unspecified osteoarthritis, unspecified site: Secondary | ICD-10-CM | POA: Diagnosis not present

## 2014-11-09 DIAGNOSIS — M2550 Pain in unspecified joint: Secondary | ICD-10-CM | POA: Diagnosis not present

## 2014-11-09 DIAGNOSIS — R072 Precordial pain: Secondary | ICD-10-CM | POA: Diagnosis not present

## 2014-11-09 DIAGNOSIS — M79606 Pain in leg, unspecified: Secondary | ICD-10-CM | POA: Diagnosis not present

## 2014-11-09 DIAGNOSIS — R0602 Shortness of breath: Secondary | ICD-10-CM | POA: Diagnosis not present

## 2014-11-29 DIAGNOSIS — E039 Hypothyroidism, unspecified: Secondary | ICD-10-CM | POA: Diagnosis not present

## 2014-11-29 DIAGNOSIS — R739 Hyperglycemia, unspecified: Secondary | ICD-10-CM | POA: Diagnosis not present

## 2014-11-29 DIAGNOSIS — E291 Testicular hypofunction: Secondary | ICD-10-CM | POA: Diagnosis not present

## 2014-11-29 DIAGNOSIS — Z79899 Other long term (current) drug therapy: Secondary | ICD-10-CM | POA: Diagnosis not present

## 2014-11-29 DIAGNOSIS — E782 Mixed hyperlipidemia: Secondary | ICD-10-CM | POA: Diagnosis not present

## 2014-11-29 DIAGNOSIS — E559 Vitamin D deficiency, unspecified: Secondary | ICD-10-CM | POA: Diagnosis not present

## 2014-11-29 DIAGNOSIS — M5137 Other intervertebral disc degeneration, lumbosacral region: Secondary | ICD-10-CM | POA: Diagnosis not present

## 2014-11-29 DIAGNOSIS — M1A20X1 Drug-induced chronic gout, unspecified site, with tophus (tophi): Secondary | ICD-10-CM | POA: Diagnosis not present

## 2014-12-03 DIAGNOSIS — M961 Postlaminectomy syndrome, not elsewhere classified: Secondary | ICD-10-CM | POA: Diagnosis not present

## 2014-12-03 DIAGNOSIS — M47896 Other spondylosis, lumbar region: Secondary | ICD-10-CM | POA: Diagnosis not present

## 2014-12-03 DIAGNOSIS — M5136 Other intervertebral disc degeneration, lumbar region: Secondary | ICD-10-CM | POA: Diagnosis not present

## 2014-12-29 DIAGNOSIS — Z79899 Other long term (current) drug therapy: Secondary | ICD-10-CM | POA: Diagnosis not present

## 2014-12-29 DIAGNOSIS — Z6831 Body mass index (BMI) 31.0-31.9, adult: Secondary | ICD-10-CM | POA: Diagnosis not present

## 2014-12-29 DIAGNOSIS — E782 Mixed hyperlipidemia: Secondary | ICD-10-CM | POA: Diagnosis not present

## 2014-12-29 DIAGNOSIS — E039 Hypothyroidism, unspecified: Secondary | ICD-10-CM | POA: Diagnosis not present

## 2014-12-29 DIAGNOSIS — G894 Chronic pain syndrome: Secondary | ICD-10-CM | POA: Diagnosis not present

## 2014-12-29 DIAGNOSIS — E559 Vitamin D deficiency, unspecified: Secondary | ICD-10-CM | POA: Diagnosis not present

## 2015-01-03 DIAGNOSIS — M19011 Primary osteoarthritis, right shoulder: Secondary | ICD-10-CM | POA: Diagnosis not present

## 2015-01-03 DIAGNOSIS — M7541 Impingement syndrome of right shoulder: Secondary | ICD-10-CM | POA: Diagnosis not present

## 2015-01-03 DIAGNOSIS — M19012 Primary osteoarthritis, left shoulder: Secondary | ICD-10-CM | POA: Diagnosis not present

## 2015-01-03 DIAGNOSIS — M7542 Impingement syndrome of left shoulder: Secondary | ICD-10-CM | POA: Diagnosis not present

## 2015-01-28 DIAGNOSIS — M5137 Other intervertebral disc degeneration, lumbosacral region: Secondary | ICD-10-CM | POA: Diagnosis not present

## 2015-01-28 DIAGNOSIS — E291 Testicular hypofunction: Secondary | ICD-10-CM | POA: Diagnosis not present

## 2015-01-28 DIAGNOSIS — G629 Polyneuropathy, unspecified: Secondary | ICD-10-CM | POA: Diagnosis not present

## 2015-01-28 DIAGNOSIS — Z6832 Body mass index (BMI) 32.0-32.9, adult: Secondary | ICD-10-CM | POA: Diagnosis not present

## 2015-01-28 DIAGNOSIS — Z79899 Other long term (current) drug therapy: Secondary | ICD-10-CM | POA: Diagnosis not present

## 2015-02-28 DIAGNOSIS — E782 Mixed hyperlipidemia: Secondary | ICD-10-CM | POA: Diagnosis not present

## 2015-02-28 DIAGNOSIS — Z6833 Body mass index (BMI) 33.0-33.9, adult: Secondary | ICD-10-CM | POA: Diagnosis not present

## 2015-02-28 DIAGNOSIS — M25561 Pain in right knee: Secondary | ICD-10-CM | POA: Diagnosis not present

## 2015-02-28 DIAGNOSIS — M5137 Other intervertebral disc degeneration, lumbosacral region: Secondary | ICD-10-CM | POA: Diagnosis not present

## 2015-02-28 DIAGNOSIS — G629 Polyneuropathy, unspecified: Secondary | ICD-10-CM | POA: Diagnosis not present

## 2015-02-28 DIAGNOSIS — Z79899 Other long term (current) drug therapy: Secondary | ICD-10-CM | POA: Diagnosis not present

## 2015-02-28 DIAGNOSIS — E663 Overweight: Secondary | ICD-10-CM | POA: Diagnosis not present

## 2015-05-25 DIAGNOSIS — M25569 Pain in unspecified knee: Secondary | ICD-10-CM | POA: Diagnosis not present

## 2015-05-25 DIAGNOSIS — M545 Low back pain: Secondary | ICD-10-CM | POA: Diagnosis not present

## 2015-05-25 DIAGNOSIS — G8929 Other chronic pain: Secondary | ICD-10-CM | POA: Diagnosis not present

## 2015-06-21 DIAGNOSIS — J301 Allergic rhinitis due to pollen: Secondary | ICD-10-CM | POA: Diagnosis not present

## 2015-06-21 DIAGNOSIS — H1013 Acute atopic conjunctivitis, bilateral: Secondary | ICD-10-CM | POA: Diagnosis not present

## 2015-06-21 DIAGNOSIS — M170 Bilateral primary osteoarthritis of knee: Secondary | ICD-10-CM | POA: Diagnosis not present

## 2015-06-21 DIAGNOSIS — M778 Other enthesopathies, not elsewhere classified: Secondary | ICD-10-CM | POA: Diagnosis not present

## 2015-06-21 DIAGNOSIS — J4530 Mild persistent asthma, uncomplicated: Secondary | ICD-10-CM | POA: Diagnosis not present

## 2015-06-21 DIAGNOSIS — M24576 Contracture, unspecified foot: Secondary | ICD-10-CM

## 2015-06-21 DIAGNOSIS — M5136 Other intervertebral disc degeneration, lumbar region: Secondary | ICD-10-CM

## 2015-06-21 DIAGNOSIS — M24573 Contracture, unspecified ankle: Secondary | ICD-10-CM

## 2015-06-21 DIAGNOSIS — E785 Hyperlipidemia, unspecified: Secondary | ICD-10-CM

## 2015-06-21 DIAGNOSIS — J309 Allergic rhinitis, unspecified: Secondary | ICD-10-CM

## 2015-06-21 DIAGNOSIS — G43009 Migraine without aura, not intractable, without status migrainosus: Secondary | ICD-10-CM | POA: Insufficient documentation

## 2015-06-21 DIAGNOSIS — G894 Chronic pain syndrome: Secondary | ICD-10-CM

## 2015-06-21 DIAGNOSIS — T84068A Wear of articular bearing surface of other internal prosthetic joint, initial encounter: Secondary | ICD-10-CM | POA: Insufficient documentation

## 2015-06-21 DIAGNOSIS — G2581 Restless legs syndrome: Secondary | ICD-10-CM | POA: Insufficient documentation

## 2015-06-21 DIAGNOSIS — M5416 Radiculopathy, lumbar region: Secondary | ICD-10-CM

## 2015-06-21 DIAGNOSIS — M542 Cervicalgia: Secondary | ICD-10-CM

## 2015-06-21 DIAGNOSIS — H547 Unspecified visual loss: Secondary | ICD-10-CM

## 2015-06-21 DIAGNOSIS — Z96659 Presence of unspecified artificial knee joint: Secondary | ICD-10-CM

## 2015-06-21 DIAGNOSIS — K279 Peptic ulcer, site unspecified, unspecified as acute or chronic, without hemorrhage or perforation: Secondary | ICD-10-CM | POA: Insufficient documentation

## 2015-06-21 DIAGNOSIS — M19279 Secondary osteoarthritis, unspecified ankle and foot: Secondary | ICD-10-CM | POA: Insufficient documentation

## 2015-06-21 DIAGNOSIS — M1990 Unspecified osteoarthritis, unspecified site: Secondary | ICD-10-CM

## 2015-06-21 DIAGNOSIS — M51369 Other intervertebral disc degeneration, lumbar region without mention of lumbar back pain or lower extremity pain: Secondary | ICD-10-CM

## 2015-06-21 HISTORY — DX: Cervicalgia: M54.2

## 2015-06-21 HISTORY — DX: Restless legs syndrome: G25.81

## 2015-06-21 HISTORY — DX: Radiculopathy, lumbar region: M54.16

## 2015-06-21 HISTORY — DX: Other intervertebral disc degeneration, lumbar region: M51.36

## 2015-06-21 HISTORY — DX: Peptic ulcer, site unspecified, unspecified as acute or chronic, without hemorrhage or perforation: K27.9

## 2015-06-21 HISTORY — DX: Contracture, unspecified ankle: M24.573

## 2015-06-21 HISTORY — DX: Acute atopic conjunctivitis, bilateral: H10.13

## 2015-06-21 HISTORY — DX: Other intervertebral disc degeneration, lumbar region without mention of lumbar back pain or lower extremity pain: M51.369

## 2015-06-21 HISTORY — DX: Wear of articular bearing surface of other internal prosthetic joint, initial encounter: T84.068A

## 2015-06-21 HISTORY — DX: Chronic pain syndrome: G89.4

## 2015-06-21 HISTORY — DX: Allergic rhinitis, unspecified: J30.9

## 2015-06-21 HISTORY — DX: Unspecified osteoarthritis, unspecified site: M19.90

## 2015-06-21 HISTORY — DX: Other enthesopathies, not elsewhere classified: M77.8

## 2015-06-21 HISTORY — DX: Wear of articular bearing surface of other internal prosthetic joint, initial encounter: Z96.659

## 2015-06-21 HISTORY — DX: Migraine without aura, not intractable, without status migrainosus: G43.009

## 2015-06-21 HISTORY — DX: Bilateral primary osteoarthritis of knee: M17.0

## 2015-06-21 HISTORY — DX: Contracture, unspecified foot: M24.576

## 2015-06-21 HISTORY — DX: Unspecified visual loss: H54.7

## 2015-06-21 HISTORY — DX: Hyperlipidemia, unspecified: E78.5

## 2015-06-21 HISTORY — DX: Secondary osteoarthritis, unspecified ankle and foot: M19.279

## 2015-06-22 DIAGNOSIS — G8929 Other chronic pain: Secondary | ICD-10-CM | POA: Diagnosis not present

## 2015-06-22 DIAGNOSIS — M1711 Unilateral primary osteoarthritis, right knee: Secondary | ICD-10-CM | POA: Diagnosis not present

## 2015-06-22 DIAGNOSIS — M545 Low back pain: Secondary | ICD-10-CM | POA: Diagnosis not present

## 2015-08-02 DIAGNOSIS — E559 Vitamin D deficiency, unspecified: Secondary | ICD-10-CM | POA: Diagnosis not present

## 2015-08-02 DIAGNOSIS — E782 Mixed hyperlipidemia: Secondary | ICD-10-CM | POA: Diagnosis not present

## 2015-08-02 DIAGNOSIS — G894 Chronic pain syndrome: Secondary | ICD-10-CM | POA: Diagnosis not present

## 2015-08-02 DIAGNOSIS — E291 Testicular hypofunction: Secondary | ICD-10-CM | POA: Diagnosis not present

## 2015-08-02 DIAGNOSIS — I10 Essential (primary) hypertension: Secondary | ICD-10-CM | POA: Diagnosis not present

## 2015-08-02 DIAGNOSIS — E034 Atrophy of thyroid (acquired): Secondary | ICD-10-CM | POA: Diagnosis not present

## 2015-08-02 DIAGNOSIS — R7989 Other specified abnormal findings of blood chemistry: Secondary | ICD-10-CM

## 2015-08-02 DIAGNOSIS — E039 Hypothyroidism, unspecified: Secondary | ICD-10-CM

## 2015-08-02 HISTORY — DX: Essential (primary) hypertension: I10

## 2015-08-02 HISTORY — DX: Vitamin D deficiency, unspecified: E55.9

## 2015-08-02 HISTORY — DX: Hypothyroidism, unspecified: E03.9

## 2015-08-02 HISTORY — DX: Mixed hyperlipidemia: E78.2

## 2015-08-02 HISTORY — DX: Other specified abnormal findings of blood chemistry: R79.89

## 2015-08-03 DIAGNOSIS — E291 Testicular hypofunction: Secondary | ICD-10-CM | POA: Diagnosis not present

## 2015-08-03 DIAGNOSIS — E039 Hypothyroidism, unspecified: Secondary | ICD-10-CM | POA: Diagnosis not present

## 2015-08-03 DIAGNOSIS — E559 Vitamin D deficiency, unspecified: Secondary | ICD-10-CM | POA: Diagnosis not present

## 2015-08-03 DIAGNOSIS — E782 Mixed hyperlipidemia: Secondary | ICD-10-CM | POA: Diagnosis not present

## 2015-08-03 DIAGNOSIS — I10 Essential (primary) hypertension: Secondary | ICD-10-CM | POA: Diagnosis not present

## 2015-09-13 DIAGNOSIS — M545 Low back pain: Secondary | ICD-10-CM | POA: Diagnosis not present

## 2015-09-13 DIAGNOSIS — G8929 Other chronic pain: Secondary | ICD-10-CM | POA: Diagnosis not present

## 2015-09-13 DIAGNOSIS — M542 Cervicalgia: Secondary | ICD-10-CM | POA: Diagnosis not present

## 2015-09-13 DIAGNOSIS — M1711 Unilateral primary osteoarthritis, right knee: Secondary | ICD-10-CM | POA: Diagnosis not present

## 2015-10-11 DIAGNOSIS — M1711 Unilateral primary osteoarthritis, right knee: Secondary | ICD-10-CM | POA: Diagnosis not present

## 2015-10-11 DIAGNOSIS — M542 Cervicalgia: Secondary | ICD-10-CM | POA: Diagnosis not present

## 2015-10-11 DIAGNOSIS — G8929 Other chronic pain: Secondary | ICD-10-CM | POA: Diagnosis not present

## 2015-10-11 DIAGNOSIS — M545 Low back pain: Secondary | ICD-10-CM | POA: Diagnosis not present

## 2015-11-03 DIAGNOSIS — Z96651 Presence of right artificial knee joint: Secondary | ICD-10-CM | POA: Diagnosis not present

## 2015-11-03 DIAGNOSIS — J4520 Mild intermittent asthma, uncomplicated: Secondary | ICD-10-CM | POA: Diagnosis not present

## 2015-11-03 DIAGNOSIS — M19072 Primary osteoarthritis, left ankle and foot: Secondary | ICD-10-CM | POA: Diagnosis not present

## 2015-11-03 DIAGNOSIS — M545 Low back pain: Secondary | ICD-10-CM | POA: Diagnosis not present

## 2015-11-03 DIAGNOSIS — I10 Essential (primary) hypertension: Secondary | ICD-10-CM | POA: Diagnosis not present

## 2015-11-03 DIAGNOSIS — M109 Gout, unspecified: Secondary | ICD-10-CM | POA: Diagnosis not present

## 2015-11-03 DIAGNOSIS — E039 Hypothyroidism, unspecified: Secondary | ICD-10-CM | POA: Diagnosis not present

## 2015-11-03 DIAGNOSIS — Z87828 Personal history of other (healed) physical injury and trauma: Secondary | ICD-10-CM | POA: Diagnosis not present

## 2015-11-04 DIAGNOSIS — Z87828 Personal history of other (healed) physical injury and trauma: Secondary | ICD-10-CM

## 2015-11-04 DIAGNOSIS — G8929 Other chronic pain: Secondary | ICD-10-CM

## 2015-11-04 DIAGNOSIS — M109 Gout, unspecified: Secondary | ICD-10-CM

## 2015-11-04 DIAGNOSIS — M19072 Primary osteoarthritis, left ankle and foot: Secondary | ICD-10-CM

## 2015-11-04 DIAGNOSIS — J4520 Mild intermittent asthma, uncomplicated: Secondary | ICD-10-CM | POA: Insufficient documentation

## 2015-11-04 DIAGNOSIS — M545 Low back pain, unspecified: Secondary | ICD-10-CM

## 2015-11-04 DIAGNOSIS — Z96651 Presence of right artificial knee joint: Secondary | ICD-10-CM | POA: Insufficient documentation

## 2015-11-04 HISTORY — DX: Presence of right artificial knee joint: Z96.651

## 2015-11-04 HISTORY — DX: Personal history of other (healed) physical injury and trauma: Z87.828

## 2015-11-04 HISTORY — DX: Gout, unspecified: M10.9

## 2015-11-04 HISTORY — DX: Other chronic pain: G89.29

## 2015-11-04 HISTORY — DX: Primary osteoarthritis, left ankle and foot: M19.072

## 2015-11-04 HISTORY — DX: Mild intermittent asthma, uncomplicated: J45.20

## 2015-11-04 HISTORY — DX: Low back pain, unspecified: M54.50

## 2016-01-31 DIAGNOSIS — M545 Low back pain: Secondary | ICD-10-CM | POA: Diagnosis not present

## 2016-01-31 DIAGNOSIS — M25572 Pain in left ankle and joints of left foot: Secondary | ICD-10-CM | POA: Diagnosis not present

## 2016-01-31 DIAGNOSIS — G8929 Other chronic pain: Secondary | ICD-10-CM | POA: Diagnosis not present

## 2016-01-31 DIAGNOSIS — M961 Postlaminectomy syndrome, not elsewhere classified: Secondary | ICD-10-CM | POA: Diagnosis not present

## 2016-02-22 DIAGNOSIS — I10 Essential (primary) hypertension: Secondary | ICD-10-CM | POA: Diagnosis not present

## 2016-02-22 DIAGNOSIS — G894 Chronic pain syndrome: Secondary | ICD-10-CM | POA: Diagnosis not present

## 2016-02-22 DIAGNOSIS — E785 Hyperlipidemia, unspecified: Secondary | ICD-10-CM | POA: Diagnosis not present

## 2016-02-22 DIAGNOSIS — Z720 Tobacco use: Secondary | ICD-10-CM | POA: Diagnosis not present

## 2016-02-22 DIAGNOSIS — E669 Obesity, unspecified: Secondary | ICD-10-CM | POA: Diagnosis not present

## 2016-02-22 DIAGNOSIS — E039 Hypothyroidism, unspecified: Secondary | ICD-10-CM | POA: Diagnosis not present

## 2016-02-22 DIAGNOSIS — Z125 Encounter for screening for malignant neoplasm of prostate: Secondary | ICD-10-CM | POA: Diagnosis not present

## 2016-02-29 DIAGNOSIS — Z79899 Other long term (current) drug therapy: Secondary | ICD-10-CM | POA: Diagnosis not present

## 2016-03-19 DIAGNOSIS — G894 Chronic pain syndrome: Secondary | ICD-10-CM | POA: Diagnosis not present

## 2016-03-19 DIAGNOSIS — E785 Hyperlipidemia, unspecified: Secondary | ICD-10-CM | POA: Diagnosis not present

## 2016-03-19 DIAGNOSIS — E669 Obesity, unspecified: Secondary | ICD-10-CM | POA: Diagnosis not present

## 2016-03-19 DIAGNOSIS — I10 Essential (primary) hypertension: Secondary | ICD-10-CM | POA: Diagnosis not present

## 2016-03-19 DIAGNOSIS — R972 Elevated prostate specific antigen [PSA]: Secondary | ICD-10-CM | POA: Diagnosis not present

## 2016-03-19 DIAGNOSIS — Z720 Tobacco use: Secondary | ICD-10-CM | POA: Diagnosis not present

## 2016-03-19 DIAGNOSIS — E039 Hypothyroidism, unspecified: Secondary | ICD-10-CM | POA: Diagnosis not present

## 2016-04-03 DIAGNOSIS — G8929 Other chronic pain: Secondary | ICD-10-CM | POA: Diagnosis not present

## 2016-04-03 DIAGNOSIS — M5441 Lumbago with sciatica, right side: Secondary | ICD-10-CM | POA: Diagnosis not present

## 2016-04-26 DIAGNOSIS — G8929 Other chronic pain: Secondary | ICD-10-CM | POA: Diagnosis not present

## 2016-04-26 DIAGNOSIS — M545 Low back pain: Secondary | ICD-10-CM | POA: Diagnosis not present

## 2016-04-26 DIAGNOSIS — M961 Postlaminectomy syndrome, not elsewhere classified: Secondary | ICD-10-CM | POA: Diagnosis not present

## 2016-04-26 DIAGNOSIS — M25562 Pain in left knee: Secondary | ICD-10-CM | POA: Diagnosis not present

## 2016-04-26 DIAGNOSIS — M25572 Pain in left ankle and joints of left foot: Secondary | ICD-10-CM | POA: Diagnosis not present

## 2016-04-27 DIAGNOSIS — Z720 Tobacco use: Secondary | ICD-10-CM | POA: Diagnosis not present

## 2016-04-27 DIAGNOSIS — R972 Elevated prostate specific antigen [PSA]: Secondary | ICD-10-CM | POA: Diagnosis not present

## 2016-04-27 DIAGNOSIS — I10 Essential (primary) hypertension: Secondary | ICD-10-CM | POA: Diagnosis not present

## 2016-04-27 DIAGNOSIS — G894 Chronic pain syndrome: Secondary | ICD-10-CM | POA: Diagnosis not present

## 2016-04-27 DIAGNOSIS — E785 Hyperlipidemia, unspecified: Secondary | ICD-10-CM | POA: Diagnosis not present

## 2016-04-27 DIAGNOSIS — E669 Obesity, unspecified: Secondary | ICD-10-CM | POA: Diagnosis not present

## 2016-04-27 DIAGNOSIS — E039 Hypothyroidism, unspecified: Secondary | ICD-10-CM | POA: Diagnosis not present

## 2016-05-21 DIAGNOSIS — M129 Arthropathy, unspecified: Secondary | ICD-10-CM | POA: Diagnosis not present

## 2016-05-21 DIAGNOSIS — E559 Vitamin D deficiency, unspecified: Secondary | ICD-10-CM | POA: Diagnosis not present

## 2016-05-21 DIAGNOSIS — M5441 Lumbago with sciatica, right side: Secondary | ICD-10-CM | POA: Diagnosis not present

## 2016-05-21 DIAGNOSIS — G8929 Other chronic pain: Secondary | ICD-10-CM | POA: Diagnosis not present

## 2016-05-21 DIAGNOSIS — E782 Mixed hyperlipidemia: Secondary | ICD-10-CM | POA: Diagnosis not present

## 2016-05-21 DIAGNOSIS — Z79899 Other long term (current) drug therapy: Secondary | ICD-10-CM | POA: Diagnosis not present

## 2016-05-29 DIAGNOSIS — M25551 Pain in right hip: Secondary | ICD-10-CM | POA: Diagnosis not present

## 2016-05-29 DIAGNOSIS — Z13820 Encounter for screening for osteoporosis: Secondary | ICD-10-CM | POA: Diagnosis not present

## 2016-05-29 DIAGNOSIS — R030 Elevated blood-pressure reading, without diagnosis of hypertension: Secondary | ICD-10-CM | POA: Diagnosis not present

## 2016-05-29 DIAGNOSIS — Z79899 Other long term (current) drug therapy: Secondary | ICD-10-CM | POA: Diagnosis not present

## 2016-05-29 DIAGNOSIS — M25552 Pain in left hip: Secondary | ICD-10-CM | POA: Diagnosis not present

## 2016-05-31 DIAGNOSIS — G8929 Other chronic pain: Secondary | ICD-10-CM | POA: Diagnosis not present

## 2016-05-31 DIAGNOSIS — M5441 Lumbago with sciatica, right side: Secondary | ICD-10-CM | POA: Diagnosis not present

## 2016-06-05 DIAGNOSIS — M25551 Pain in right hip: Secondary | ICD-10-CM | POA: Diagnosis not present

## 2016-06-05 DIAGNOSIS — Z79899 Other long term (current) drug therapy: Secondary | ICD-10-CM | POA: Diagnosis not present

## 2016-06-05 DIAGNOSIS — G8929 Other chronic pain: Secondary | ICD-10-CM | POA: Diagnosis not present

## 2016-06-05 DIAGNOSIS — M5441 Lumbago with sciatica, right side: Secondary | ICD-10-CM | POA: Diagnosis not present

## 2016-06-05 DIAGNOSIS — M25562 Pain in left knee: Secondary | ICD-10-CM | POA: Diagnosis not present

## 2016-06-05 DIAGNOSIS — M5136 Other intervertebral disc degeneration, lumbar region: Secondary | ICD-10-CM | POA: Diagnosis not present

## 2016-06-05 DIAGNOSIS — Z0000 Encounter for general adult medical examination without abnormal findings: Secondary | ICD-10-CM | POA: Diagnosis not present

## 2016-06-12 DIAGNOSIS — T8484XA Pain due to internal orthopedic prosthetic devices, implants and grafts, initial encounter: Secondary | ICD-10-CM | POA: Diagnosis not present

## 2016-06-14 DIAGNOSIS — Z79899 Other long term (current) drug therapy: Secondary | ICD-10-CM | POA: Diagnosis not present

## 2016-06-15 DIAGNOSIS — G8929 Other chronic pain: Secondary | ICD-10-CM | POA: Diagnosis not present

## 2016-06-15 DIAGNOSIS — M5441 Lumbago with sciatica, right side: Secondary | ICD-10-CM | POA: Diagnosis not present

## 2016-06-15 DIAGNOSIS — M25551 Pain in right hip: Secondary | ICD-10-CM | POA: Diagnosis not present

## 2016-06-15 DIAGNOSIS — M5136 Other intervertebral disc degeneration, lumbar region: Secondary | ICD-10-CM | POA: Diagnosis not present

## 2016-06-19 DIAGNOSIS — T8484XA Pain due to internal orthopedic prosthetic devices, implants and grafts, initial encounter: Secondary | ICD-10-CM | POA: Diagnosis not present

## 2016-06-19 DIAGNOSIS — S79911A Unspecified injury of right hip, initial encounter: Secondary | ICD-10-CM | POA: Diagnosis not present

## 2016-06-22 DIAGNOSIS — T8484XA Pain due to internal orthopedic prosthetic devices, implants and grafts, initial encounter: Secondary | ICD-10-CM | POA: Diagnosis not present

## 2016-07-05 DIAGNOSIS — M461 Sacroiliitis, not elsewhere classified: Secondary | ICD-10-CM | POA: Diagnosis not present

## 2016-07-05 DIAGNOSIS — M25551 Pain in right hip: Secondary | ICD-10-CM | POA: Diagnosis not present

## 2016-07-13 DIAGNOSIS — G8929 Other chronic pain: Secondary | ICD-10-CM | POA: Diagnosis not present

## 2016-07-13 DIAGNOSIS — Z79899 Other long term (current) drug therapy: Secondary | ICD-10-CM | POA: Diagnosis not present

## 2016-07-13 DIAGNOSIS — M5441 Lumbago with sciatica, right side: Secondary | ICD-10-CM | POA: Diagnosis not present

## 2016-07-13 DIAGNOSIS — M25562 Pain in left knee: Secondary | ICD-10-CM | POA: Diagnosis not present

## 2016-07-13 DIAGNOSIS — M5136 Other intervertebral disc degeneration, lumbar region: Secondary | ICD-10-CM | POA: Diagnosis not present

## 2016-08-13 DIAGNOSIS — D539 Nutritional anemia, unspecified: Secondary | ICD-10-CM | POA: Diagnosis not present

## 2016-08-13 DIAGNOSIS — R0602 Shortness of breath: Secondary | ICD-10-CM | POA: Diagnosis not present

## 2016-08-13 DIAGNOSIS — Z125 Encounter for screening for malignant neoplasm of prostate: Secondary | ICD-10-CM | POA: Diagnosis not present

## 2016-08-13 DIAGNOSIS — Z79899 Other long term (current) drug therapy: Secondary | ICD-10-CM | POA: Diagnosis not present

## 2016-08-13 DIAGNOSIS — R109 Unspecified abdominal pain: Secondary | ICD-10-CM | POA: Diagnosis not present

## 2016-08-13 DIAGNOSIS — R5383 Other fatigue: Secondary | ICD-10-CM | POA: Diagnosis not present

## 2016-08-13 DIAGNOSIS — Z1389 Encounter for screening for other disorder: Secondary | ICD-10-CM | POA: Diagnosis not present

## 2016-08-13 DIAGNOSIS — J309 Allergic rhinitis, unspecified: Secondary | ICD-10-CM | POA: Diagnosis not present

## 2016-08-13 DIAGNOSIS — E559 Vitamin D deficiency, unspecified: Secondary | ICD-10-CM | POA: Diagnosis not present

## 2016-08-13 DIAGNOSIS — E782 Mixed hyperlipidemia: Secondary | ICD-10-CM | POA: Diagnosis not present

## 2016-08-13 DIAGNOSIS — M129 Arthropathy, unspecified: Secondary | ICD-10-CM | POA: Diagnosis not present

## 2016-08-13 DIAGNOSIS — R5381 Other malaise: Secondary | ICD-10-CM | POA: Diagnosis not present

## 2016-08-13 DIAGNOSIS — I10 Essential (primary) hypertension: Secondary | ICD-10-CM | POA: Diagnosis not present

## 2016-08-13 DIAGNOSIS — R739 Hyperglycemia, unspecified: Secondary | ICD-10-CM | POA: Diagnosis not present

## 2016-09-12 DIAGNOSIS — R748 Abnormal levels of other serum enzymes: Secondary | ICD-10-CM | POA: Diagnosis not present

## 2016-09-12 DIAGNOSIS — Z79899 Other long term (current) drug therapy: Secondary | ICD-10-CM | POA: Diagnosis not present

## 2016-09-12 DIAGNOSIS — F17200 Nicotine dependence, unspecified, uncomplicated: Secondary | ICD-10-CM | POA: Diagnosis not present

## 2016-09-12 DIAGNOSIS — R0602 Shortness of breath: Secondary | ICD-10-CM | POA: Diagnosis not present

## 2016-09-12 DIAGNOSIS — E782 Mixed hyperlipidemia: Secondary | ICD-10-CM | POA: Diagnosis not present

## 2016-09-12 DIAGNOSIS — R945 Abnormal results of liver function studies: Secondary | ICD-10-CM | POA: Diagnosis not present

## 2016-09-12 DIAGNOSIS — Z87891 Personal history of nicotine dependence: Secondary | ICD-10-CM | POA: Diagnosis not present

## 2016-09-12 DIAGNOSIS — M549 Dorsalgia, unspecified: Secondary | ICD-10-CM | POA: Diagnosis not present

## 2016-10-12 DIAGNOSIS — F17200 Nicotine dependence, unspecified, uncomplicated: Secondary | ICD-10-CM | POA: Diagnosis not present

## 2016-10-12 DIAGNOSIS — Z87891 Personal history of nicotine dependence: Secondary | ICD-10-CM | POA: Diagnosis not present

## 2016-10-12 DIAGNOSIS — K5909 Other constipation: Secondary | ICD-10-CM | POA: Diagnosis not present

## 2016-10-12 DIAGNOSIS — Z79899 Other long term (current) drug therapy: Secondary | ICD-10-CM | POA: Diagnosis not present

## 2016-10-12 DIAGNOSIS — M5441 Lumbago with sciatica, right side: Secondary | ICD-10-CM | POA: Diagnosis not present

## 2016-10-12 DIAGNOSIS — G8929 Other chronic pain: Secondary | ICD-10-CM | POA: Diagnosis not present

## 2016-10-12 DIAGNOSIS — R0602 Shortness of breath: Secondary | ICD-10-CM | POA: Diagnosis not present

## 2016-10-12 DIAGNOSIS — R0989 Other specified symptoms and signs involving the circulatory and respiratory systems: Secondary | ICD-10-CM | POA: Diagnosis not present

## 2016-10-12 DIAGNOSIS — M79604 Pain in right leg: Secondary | ICD-10-CM | POA: Diagnosis not present

## 2016-11-07 DIAGNOSIS — G8929 Other chronic pain: Secondary | ICD-10-CM | POA: Diagnosis not present

## 2016-11-07 DIAGNOSIS — R0602 Shortness of breath: Secondary | ICD-10-CM | POA: Diagnosis not present

## 2016-11-07 DIAGNOSIS — Z79899 Other long term (current) drug therapy: Secondary | ICD-10-CM | POA: Diagnosis not present

## 2016-11-07 DIAGNOSIS — I10 Essential (primary) hypertension: Secondary | ICD-10-CM | POA: Diagnosis not present

## 2016-11-07 DIAGNOSIS — M5441 Lumbago with sciatica, right side: Secondary | ICD-10-CM | POA: Diagnosis not present

## 2016-12-06 DIAGNOSIS — I10 Essential (primary) hypertension: Secondary | ICD-10-CM | POA: Diagnosis not present

## 2016-12-06 DIAGNOSIS — M5441 Lumbago with sciatica, right side: Secondary | ICD-10-CM | POA: Diagnosis not present

## 2016-12-06 DIAGNOSIS — G8929 Other chronic pain: Secondary | ICD-10-CM | POA: Diagnosis not present

## 2016-12-06 DIAGNOSIS — Z79899 Other long term (current) drug therapy: Secondary | ICD-10-CM | POA: Diagnosis not present

## 2016-12-06 DIAGNOSIS — Z87891 Personal history of nicotine dependence: Secondary | ICD-10-CM | POA: Diagnosis not present

## 2016-12-06 DIAGNOSIS — F17200 Nicotine dependence, unspecified, uncomplicated: Secondary | ICD-10-CM | POA: Diagnosis not present

## 2017-01-02 DIAGNOSIS — Z87891 Personal history of nicotine dependence: Secondary | ICD-10-CM | POA: Diagnosis not present

## 2017-01-02 DIAGNOSIS — Z716 Tobacco abuse counseling: Secondary | ICD-10-CM | POA: Diagnosis not present

## 2017-01-02 DIAGNOSIS — M129 Arthropathy, unspecified: Secondary | ICD-10-CM | POA: Diagnosis not present

## 2017-01-02 DIAGNOSIS — E559 Vitamin D deficiency, unspecified: Secondary | ICD-10-CM | POA: Diagnosis not present

## 2017-01-02 DIAGNOSIS — R5383 Other fatigue: Secondary | ICD-10-CM | POA: Diagnosis not present

## 2017-01-02 DIAGNOSIS — D539 Nutritional anemia, unspecified: Secondary | ICD-10-CM | POA: Diagnosis not present

## 2017-01-02 DIAGNOSIS — M5441 Lumbago with sciatica, right side: Secondary | ICD-10-CM | POA: Diagnosis not present

## 2017-01-02 DIAGNOSIS — Z79899 Other long term (current) drug therapy: Secondary | ICD-10-CM | POA: Diagnosis not present

## 2017-01-02 DIAGNOSIS — R0602 Shortness of breath: Secondary | ICD-10-CM | POA: Diagnosis not present

## 2017-01-02 DIAGNOSIS — E7800 Pure hypercholesterolemia, unspecified: Secondary | ICD-10-CM | POA: Diagnosis not present

## 2017-01-30 DIAGNOSIS — I10 Essential (primary) hypertension: Secondary | ICD-10-CM | POA: Diagnosis not present

## 2017-01-30 DIAGNOSIS — Z79899 Other long term (current) drug therapy: Secondary | ICD-10-CM | POA: Diagnosis not present

## 2017-01-30 DIAGNOSIS — M5441 Lumbago with sciatica, right side: Secondary | ICD-10-CM | POA: Diagnosis not present

## 2017-01-30 DIAGNOSIS — M25552 Pain in left hip: Secondary | ICD-10-CM | POA: Diagnosis not present

## 2017-01-30 DIAGNOSIS — M25551 Pain in right hip: Secondary | ICD-10-CM | POA: Diagnosis not present

## 2017-02-21 DIAGNOSIS — S60562A Insect bite (nonvenomous) of left hand, initial encounter: Secondary | ICD-10-CM | POA: Diagnosis not present

## 2017-02-26 DIAGNOSIS — M5137 Other intervertebral disc degeneration, lumbosacral region: Secondary | ICD-10-CM | POA: Diagnosis not present

## 2017-02-26 DIAGNOSIS — M25511 Pain in right shoulder: Secondary | ICD-10-CM | POA: Diagnosis not present

## 2017-02-26 DIAGNOSIS — M25512 Pain in left shoulder: Secondary | ICD-10-CM | POA: Diagnosis not present

## 2017-02-26 DIAGNOSIS — M545 Low back pain: Secondary | ICD-10-CM | POA: Diagnosis not present

## 2017-02-28 DIAGNOSIS — M5441 Lumbago with sciatica, right side: Secondary | ICD-10-CM | POA: Diagnosis not present

## 2017-02-28 DIAGNOSIS — Z79899 Other long term (current) drug therapy: Secondary | ICD-10-CM | POA: Diagnosis not present

## 2017-02-28 DIAGNOSIS — Z716 Tobacco abuse counseling: Secondary | ICD-10-CM | POA: Diagnosis not present

## 2017-02-28 DIAGNOSIS — I10 Essential (primary) hypertension: Secondary | ICD-10-CM | POA: Diagnosis not present

## 2017-02-28 DIAGNOSIS — R0602 Shortness of breath: Secondary | ICD-10-CM | POA: Diagnosis not present

## 2017-02-28 DIAGNOSIS — M25519 Pain in unspecified shoulder: Secondary | ICD-10-CM | POA: Diagnosis not present

## 2017-02-28 DIAGNOSIS — M25562 Pain in left knee: Secondary | ICD-10-CM | POA: Diagnosis not present

## 2017-03-12 DIAGNOSIS — M25551 Pain in right hip: Secondary | ICD-10-CM | POA: Diagnosis not present

## 2017-03-12 DIAGNOSIS — M25552 Pain in left hip: Secondary | ICD-10-CM | POA: Diagnosis not present

## 2017-03-12 DIAGNOSIS — M47896 Other spondylosis, lumbar region: Secondary | ICD-10-CM | POA: Diagnosis not present

## 2017-03-12 DIAGNOSIS — M160 Bilateral primary osteoarthritis of hip: Secondary | ICD-10-CM | POA: Diagnosis not present

## 2017-03-28 DIAGNOSIS — K5900 Constipation, unspecified: Secondary | ICD-10-CM | POA: Diagnosis not present

## 2017-03-28 DIAGNOSIS — Z79899 Other long term (current) drug therapy: Secondary | ICD-10-CM | POA: Diagnosis not present

## 2017-04-18 DIAGNOSIS — Y999 Unspecified external cause status: Secondary | ICD-10-CM | POA: Diagnosis not present

## 2017-04-18 DIAGNOSIS — R079 Chest pain, unspecified: Secondary | ICD-10-CM | POA: Diagnosis not present

## 2017-04-18 DIAGNOSIS — K2210 Ulcer of esophagus without bleeding: Secondary | ICD-10-CM | POA: Diagnosis not present

## 2017-04-18 DIAGNOSIS — X58XXXA Exposure to other specified factors, initial encounter: Secondary | ICD-10-CM | POA: Diagnosis not present

## 2017-04-18 DIAGNOSIS — K269 Duodenal ulcer, unspecified as acute or chronic, without hemorrhage or perforation: Secondary | ICD-10-CM | POA: Diagnosis not present

## 2017-04-18 DIAGNOSIS — T18128A Food in esophagus causing other injury, initial encounter: Secondary | ICD-10-CM | POA: Diagnosis not present

## 2017-04-18 DIAGNOSIS — K2930 Chronic superficial gastritis without bleeding: Secondary | ICD-10-CM | POA: Diagnosis not present

## 2017-04-18 DIAGNOSIS — K2940 Chronic atrophic gastritis without bleeding: Secondary | ICD-10-CM | POA: Diagnosis not present

## 2017-04-18 DIAGNOSIS — T18108A Unspecified foreign body in esophagus causing other injury, initial encounter: Secondary | ICD-10-CM | POA: Diagnosis not present

## 2017-04-18 DIAGNOSIS — R1310 Dysphagia, unspecified: Secondary | ICD-10-CM | POA: Diagnosis not present

## 2017-04-18 DIAGNOSIS — K449 Diaphragmatic hernia without obstruction or gangrene: Secondary | ICD-10-CM | POA: Diagnosis not present

## 2017-04-18 HISTORY — DX: Unspecified foreign body in esophagus causing other injury, initial encounter: T18.108A

## 2017-09-19 DIAGNOSIS — M1712 Unilateral primary osteoarthritis, left knee: Secondary | ICD-10-CM | POA: Insufficient documentation

## 2017-09-19 DIAGNOSIS — T84022A Instability of internal right knee prosthesis, initial encounter: Secondary | ICD-10-CM

## 2017-09-19 DIAGNOSIS — Z96651 Presence of right artificial knee joint: Secondary | ICD-10-CM | POA: Insufficient documentation

## 2017-09-19 HISTORY — DX: Presence of right artificial knee joint: Z96.651

## 2017-09-19 HISTORY — DX: Instability of internal right knee prosthesis, initial encounter: T84.022A

## 2017-09-19 HISTORY — DX: Unilateral primary osteoarthritis, left knee: M17.12

## 2018-08-22 DIAGNOSIS — R002 Palpitations: Secondary | ICD-10-CM

## 2018-08-22 DIAGNOSIS — R0609 Other forms of dyspnea: Secondary | ICD-10-CM

## 2018-08-22 HISTORY — DX: Palpitations: R00.2

## 2018-08-22 HISTORY — DX: Other forms of dyspnea: R06.09

## 2018-09-29 DIAGNOSIS — R9439 Abnormal result of other cardiovascular function study: Secondary | ICD-10-CM

## 2018-09-29 HISTORY — DX: Abnormal result of other cardiovascular function study: R94.39

## 2019-02-18 DIAGNOSIS — R079 Chest pain, unspecified: Secondary | ICD-10-CM | POA: Insufficient documentation

## 2019-02-18 HISTORY — DX: Chest pain, unspecified: R07.9

## 2019-10-27 ENCOUNTER — Other Ambulatory Visit (HOSPITAL_COMMUNITY): Payer: Self-pay | Admitting: Specialist

## 2019-10-27 ENCOUNTER — Inpatient Hospital Stay (HOSPITAL_COMMUNITY): Admission: RE | Admit: 2019-10-27 | Payer: Self-pay | Source: Ambulatory Visit

## 2019-10-27 DIAGNOSIS — Z720 Tobacco use: Secondary | ICD-10-CM

## 2019-10-27 DIAGNOSIS — M79606 Pain in leg, unspecified: Secondary | ICD-10-CM

## 2019-11-06 ENCOUNTER — Other Ambulatory Visit: Payer: Self-pay

## 2019-11-06 ENCOUNTER — Ambulatory Visit (HOSPITAL_COMMUNITY)
Admission: RE | Admit: 2019-11-06 | Discharge: 2019-11-06 | Disposition: A | Discharge status: Home / Self Care | Payer: Medicare HMO | Source: Ambulatory Visit | Attending: Surgery | Admitting: Surgery

## 2019-11-06 DIAGNOSIS — Z720 Tobacco use: Secondary | ICD-10-CM | POA: Diagnosis present

## 2019-11-06 DIAGNOSIS — M79606 Pain in leg, unspecified: Secondary | ICD-10-CM | POA: Insufficient documentation

## 2020-01-11 DIAGNOSIS — Z981 Arthrodesis status: Secondary | ICD-10-CM | POA: Insufficient documentation

## 2020-01-11 HISTORY — DX: Arthrodesis status: Z98.1

## 2020-09-14 DIAGNOSIS — M4810 Ankylosing hyperostosis [Forestier], site unspecified: Secondary | ICD-10-CM | POA: Insufficient documentation

## 2020-09-14 DIAGNOSIS — M160 Bilateral primary osteoarthritis of hip: Secondary | ICD-10-CM | POA: Insufficient documentation

## 2020-09-14 HISTORY — DX: Ankylosing hyperostosis (forestier), site unspecified: M48.10

## 2020-09-14 HISTORY — DX: Bilateral primary osteoarthritis of hip: M16.0

## 2020-10-06 DIAGNOSIS — M25511 Pain in right shoulder: Secondary | ICD-10-CM | POA: Insufficient documentation

## 2020-10-06 DIAGNOSIS — M7541 Impingement syndrome of right shoulder: Secondary | ICD-10-CM | POA: Insufficient documentation

## 2020-10-06 DIAGNOSIS — G5601 Carpal tunnel syndrome, right upper limb: Secondary | ICD-10-CM

## 2020-10-06 DIAGNOSIS — M5030 Other cervical disc degeneration, unspecified cervical region: Secondary | ICD-10-CM

## 2020-10-06 DIAGNOSIS — M25512 Pain in left shoulder: Secondary | ICD-10-CM

## 2020-10-06 HISTORY — DX: Other cervical disc degeneration, unspecified cervical region: M50.30

## 2020-10-06 HISTORY — DX: Impingement syndrome of right shoulder: M75.41

## 2020-10-06 HISTORY — DX: Carpal tunnel syndrome, right upper limb: G56.01

## 2020-10-06 HISTORY — DX: Pain in right shoulder: M25.511

## 2020-10-06 HISTORY — DX: Pain in left shoulder: M25.512

## 2020-12-16 ENCOUNTER — Encounter: Payer: Self-pay | Admitting: Cardiology

## 2021-01-19 DIAGNOSIS — E785 Hyperlipidemia, unspecified: Secondary | ICD-10-CM | POA: Insufficient documentation

## 2021-01-19 DIAGNOSIS — I10 Essential (primary) hypertension: Secondary | ICD-10-CM | POA: Insufficient documentation

## 2021-01-19 HISTORY — DX: Essential (primary) hypertension: I10

## 2021-01-24 ENCOUNTER — Encounter: Payer: Self-pay | Admitting: Cardiology

## 2021-01-24 ENCOUNTER — Other Ambulatory Visit: Payer: Self-pay

## 2021-01-24 ENCOUNTER — Ambulatory Visit (INDEPENDENT_AMBULATORY_CARE_PROVIDER_SITE_OTHER): Payer: Medicare HMO | Admitting: Cardiology

## 2021-01-24 VITALS — BP 128/72 | HR 103 | Ht 68.0 in | Wt 198.4 lb

## 2021-01-24 DIAGNOSIS — R0609 Other forms of dyspnea: Secondary | ICD-10-CM

## 2021-01-24 DIAGNOSIS — E669 Obesity, unspecified: Secondary | ICD-10-CM

## 2021-01-24 DIAGNOSIS — E66811 Obesity, class 1: Secondary | ICD-10-CM

## 2021-01-24 DIAGNOSIS — I10 Essential (primary) hypertension: Secondary | ICD-10-CM

## 2021-01-24 DIAGNOSIS — E782 Mixed hyperlipidemia: Secondary | ICD-10-CM

## 2021-01-24 DIAGNOSIS — I209 Angina pectoris, unspecified: Secondary | ICD-10-CM

## 2021-01-24 DIAGNOSIS — I259 Chronic ischemic heart disease, unspecified: Secondary | ICD-10-CM

## 2021-01-24 HISTORY — DX: Obesity, unspecified: E66.9

## 2021-01-24 HISTORY — DX: Angina pectoris, unspecified: I20.9

## 2021-01-24 HISTORY — DX: Obesity, class 1: E66.811

## 2021-01-24 MED ORDER — METOPROLOL TARTRATE 100 MG PO TABS: 100.0000 mg | ORAL_TABLET | Freq: Once | ORAL | 0 refills | Status: AC

## 2021-01-24 MED ORDER — NITROGLYCERIN 0.4 MG SL SUBL: 0.4000 mg | SUBLINGUAL_TABLET | SUBLINGUAL | 6 refills | Status: AC | PRN

## 2021-01-24 NOTE — Addendum Note (Signed)
Addended by: Truddie Hidden on: 01/24/2021 03:31 PM   Modules accepted: Orders

## 2021-01-24 NOTE — Patient Instructions (Signed)
Medication Instructions:  Your physician has recommended you make the following change in your medication:   Use nitroglycerin 1 tablet placed under the tongue at the first sign of chest pain or an angina attack. 1 tablet may be used every 5 minutes as needed, for up to 15 minutes. Do not take more than 3 tablets in 15 minutes. If pain persist call 911 or go to the nearest ED.   *If you need a refill on your cardiac medications before your next appointment, please call your pharmacy*   Lab Work: Your physician recommends that you have a BMET today in the office.  If you have labs (blood work) drawn today and your tests are completely normal, you will receive your results only by: [place] (if you have MyChart) OR A paper copy in the mail If you have any lab test that is abnormal or we need to change your treatment, we will call you to review the results.   Testing/Procedures: Your cardiac CT will be scheduled at:   [facility] [place], [place] [address] [phone]   If scheduled at [facility], please arrive at the [facility] main entrance of [facility] 30 minutes prior to test start time. Proceed to the [facility] Radiology Department (first floor) to check-in and test prep.  Please follow these instructions carefully (unless otherwise directed):  Hold all erectile dysfunction medications at least 3 days (72 hrs) prior to test.  On the Night Before the Test: Be sure to Drink plenty of water. Do not consume any caffeinated/decaffeinated beverages or chocolate 12 hours prior to your test. Do not take any antihistamines 12 hours prior to your test.   On the Day of the Test: Drink plenty of water. Do not drink any water within one hour of the test. Do not eat any food 4 hours prior to the test. You may take your regular medications prior to the test.  Take metoprolol (Lopressor) two hours prior to test. This is for a one  time dose. HOLD Hydrochlorothiazide morning of the test.       After the Test: Drink plenty of water. After receiving IV contrast, you may experience a mild flushed feeling. This is normal. On occasion, you may experience a mild rash up to 24 hours after the test. This is not dangerous. If this occurs, you can take Benadryl 25 mg and increase your fluid intake. If you experience trouble breathing, this can be serious. If it is severe call 911 IMMEDIATELY. If it is mild, please call our office. If you take any of these medications: Glipizide/Metformin, Avandament, Glucavance, please do not take 48 hours after completing test unless otherwise instructed.   Once we have confirmed authorization from your insurance company, we will call you to set up a date and time for your test. Based on how quickly your insurance processes prior authorizations requests, please allow up to 4 weeks to be contacted for scheduling your Cardiac CT appointment. Be advised that routine Cardiac CT appointments could be scheduled as many as 8 weeks after your provider has ordered it.  For non-scheduling related questions, please contact the cardiac imaging nurse navigator should you have any questions/concerns: Marchia Bond, Cardiac Imaging Nurse Navigator Burley Saver, Interim Cardiac Imaging Nurse [facility] Services Direct Office Dial: [phone]   For scheduling needs, including cancellations and rescheduling, please call Vivien Rota at [phone].    Your physician has requested that  you have an echocardiogram. Echocardiography is a painless test that uses sound waves to create images of your heart. It provides your doctor with information about the size and shape of your heart and how well your heart's chambers and valves are working. This procedure takes approximately one hour. There are no restrictions for this procedure.   Follow-Up: At [facility], you and your health needs are our  priority.  As part of our continuing mission to provide you with exceptional heart care, we have created designated Provider Care Teams.  These Care Teams include your primary Cardiologist (physician) and Advanced Practice Providers (APPs -  Physician Assistants and Nurse Practitioners) who all work together to provide you with the care you need, when you need it.  We recommend signing up for the patient portal called "MyChart".  Sign up information is provided on this After Visit Summary.  MyChart is used to connect with patients for Virtual Visits (Telemedicine).  Patients are able to view lab/test results, encounter notes, upcoming appointments, etc.  Non-urgent messages can be sent to your provider as well.   To learn more about what you can do with MyChart, go to [email or web address].    Your next appointment:   6 month(s)  The format for your next appointment:   In Person  Provider:   Jyl Heinz, MD   Other Instructions Cardiac CT Angiogram A cardiac CT angiogram is a procedure to look at the heart and the area around the heart. It may be done to help find the cause of chest pains or other symptoms of heart disease. During this procedure, a substance called contrast dye is injected into the blood vessels in the area to be checked. A large X-ray machine, called a CT scanner, then takes detailed pictures of the heart and the surrounding area. The procedure is also sometimes called a coronary CT angiogram, coronary artery scanning, or CTA. A cardiac CT angiogram allows the health care provider to see how well blood is flowing to and from the heart. The health care provider will be able to see if there are any problems, such as: Blockage or narrowing of the coronary arteries in the heart. Fluid around the heart. Signs of weakness or disease in the muscles, valves, and tissues of the heart. Tell a health care provider about: Any allergies you have. This is especially important if you  have had a previous allergic reaction to contrast dye. All medicines you are taking, including vitamins, herbs, eye drops, creams, and over-the-counter medicines. Any blood disorders you have. Any surgeries you have had. Any medical conditions you have. Whether you are pregnant or may be pregnant. Any anxiety disorders, chronic pain, or other conditions you have that may increase your stress or prevent you from lying still. What are the risks? Generally, this is a safe procedure. However, problems may occur, including: Bleeding. Infection. Allergic reactions to medicines or dyes. Damage to other structures or organs. Kidney damage from the contrast dye that is used. Increased risk of cancer from radiation exposure. This risk is low. Talk with your health care provider about: The risks and benefits of testing. How you can receive the lowest dose of radiation. What happens before the procedure? Wear comfortable clothing and remove any jewelry, glasses, dentures, and hearing aids. Follow instructions from your health care provider about eating and drinking. This may include: For 12 hours before the procedure -- avoid caffeine. This includes tea, coffee, soda, energy drinks, and diet pills. Drink plenty of  water or other fluids that do not have caffeine in them. Being well hydrated can prevent complications. For 4-6 hours before the procedure -- stop eating and drinking. The contrast dye can cause nausea, but this is less likely if your stomach is empty. Ask your health care provider about changing or stopping your regular medicines. This is especially important if you are taking diabetes medicines, blood thinners, or medicines to treat problems with erections (erectile dysfunction). What happens during the procedure?  Hair on your chest may need to be removed so that small sticky patches called electrodes can be placed on your chest. These will transmit information that helps to monitor your  heart during the procedure. An IV will be inserted into one of your veins. You might be given a medicine to control your heart rate during the procedure. This will help to ensure that good images are obtained. You will be asked to lie on an exam table. This table will slide in and out of the CT machine during the procedure. Contrast dye will be injected into the IV. You might feel warm, or you may get a metallic taste in your mouth. You will be given a medicine called nitroglycerin. This will relax or dilate the arteries in your heart. The table that you are lying on will move into the CT machine tunnel for the scan. The person running the machine will give you instructions while the scans are being done. You may be asked to: Keep your arms above your head. Hold your breath. Stay very still, even if the table is moving. When the scanning is complete, you will be moved out of the machine. The IV will be removed. The procedure may vary among health care providers and hospitals. What can I expect after the procedure? After your procedure, it is common to have: A metallic taste in your mouth from the contrast dye. A feeling of warmth. A headache from the nitroglycerin. Follow these instructions at home: Take over-the-counter and prescription medicines only as told by your health care provider. If you are told, drink enough fluid to keep your urine pale yellow. This will help to flush the contrast dye out of your body. Most people can return to their normal activities right after the procedure. Ask your health care provider what activities are safe for you. It is up to you to get the results of your procedure. Ask your health care provider, or the department that is doing the procedure, when your results will be ready. Keep all follow-up visits as told by your health care provider. This is important. Contact a health care provider if: You have any symptoms of allergy to the contrast dye. These  include: Shortness of breath. Rash or hives. A racing heartbeat. Summary A cardiac CT angiogram is a procedure to look at the heart and the area around the heart. It may be done to help find the cause of chest pains or other symptoms of heart disease. During this procedure, a large X-ray machine, called a CT scanner, takes detailed pictures of the heart and the surrounding area after a contrast dye has been injected into blood vessels in the area. Ask your health care provider about changing or stopping your regular medicines before the procedure. This is especially important if you are taking diabetes medicines, blood thinners, or medicines to treat erectile dysfunction. If you are told, drink enough fluid to keep your urine pale yellow. This will help to flush the contrast dye out of  your body. This information is not intended to replace advice given to you by your health care provider. Make sure you discuss any questions you have with your health care provider. Document Revised: 12/03/2018 Document Reviewed: 12/03/2018 Elsevier Patient Education  [place].  Echocardiogram An echocardiogram is a test that uses sound waves (ultrasound) to produce images of the heart. Images from an echocardiogram can provide important information about: Heart size and shape. The size and thickness and movement of your heart's walls. Heart muscle function and strength. Heart valve function or if you have stenosis. Stenosis is when the heart valves are too narrow. If blood is flowing backward through the heart valves (regurgitation). A tumor or infectious growth around the heart valves. Areas of heart muscle that are not working well because of poor blood flow or injury from a heart attack. Aneurysm detection. An aneurysm is a weak or damaged part of an artery wall. The wall bulges out from the normal force of blood pumping through the body. Tell a health care provider about: Any allergies you  have. All medicines you are taking, including vitamins, herbs, eye drops, creams, and over-the-counter medicines. Any blood disorders you have. Any surgeries you have had. Any medical conditions you have. Whether you are pregnant or may be pregnant. What are the risks? Generally, this is a safe test. However, problems may occur, including an allergic reaction to dye (contrast) that may be used during the test. What happens before the test? No specific preparation is needed. You may eat and drink normally. What happens during the test?  You will take off your clothes from the waist up and put on a hospital gown. Electrodes or electrocardiogram (ECG)patches may be placed on your chest. The electrodes or patches are then connected to a device that monitors your heart rate and rhythm. You will lie down on a table for an ultrasound exam. A gel will be applied to your chest to help sound waves pass through your skin. A handheld device, called a transducer, will be pressed against your chest and moved over your heart. The transducer produces sound waves that travel to your heart and bounce back (or "echo" back) to the transducer. These sound waves will be captured in real-time and changed into images of your heart that can be viewed on a video monitor. The images will be recorded on a computer and reviewed by your health care provider. You may be asked to change positions or hold your breath for a short time. This makes it easier to get different views or better views of your heart. In some cases, you may receive contrast through an IV in one of your veins. This can improve the quality of the pictures from your heart. The procedure may vary among health care providers and hospitals. What can I expect after the test? You may return to your normal, everyday life, including diet, activities, and medicines, unless your health care provider tells you not to do that. Follow these instructions at home: It is  up to you to get the results of your test. Ask your health care provider, or the department that is doing the test, when your results will be ready. Keep all follow-up visits. This is important. Summary An echocardiogram is a test that uses sound waves (ultrasound) to produce images of the heart. Images from an echocardiogram can provide important information about the size and shape of your heart, heart muscle function, heart valve function, and other possible heart problems.  You do not need to do anything to prepare before this test. You may eat and drink normally. After the echocardiogram is completed, you may return to your normal, everyday life, unless your health care provider tells you not to do that. This information is not intended to replace advice given to you by your health care provider. Make sure you discuss any questions you have with your health care provider. Document Revised: 12/01/2019 Document Reviewed: 12/01/2019 Elsevier Patient Education  2022 [employer].

## 2021-01-24 NOTE — Progress Notes (Signed)
Cardiology Office Note:    Date:  01/24/2021   ID:  John Weeks, DOB 1955/06/22, MRN [id number]  PCP:  Harvie Junior, MD  Cardiologist:  Jenean Lindau, MD   Referring MD: Harvie Junior, MD    ASSESSMENT:    1. Essential hypertension   2. Angina pectoris ([place])   3. Mixed hyperlipidemia   4. DOE (dyspnea on exertion)   5. Obesity (BMI 30.0-34.9)    PLAN:    In order of problems listed above:  Primary prevention stressed with the patient.  Importance of compliance with diet medication stressed any vocalized understanding. Angina pectoris: Patient's symptoms are concerning.  He is taking an aspirin 81 mg coated on a daily basis.  Sublingual nitroglycerin prescription was sent, its protocol and 911 protocol explained and the patient vocalized understanding questions were answered to the patient's satisfaction.  I will set him up for a CT coronary angiography with FFR and is agreeable. Essential hypertension: Blood pressure stable and diet was emphasized.  Lifestyle modification urged. Mixed dyslipidemia obesity: Weight reduction stressed risks of obesity explained.  Lipids followed by primary care physician. Further recommendations will be made based on the findings of the aforementioned test.Patient will be seen in follow-up appointment in 6 months or earlier if the patient has any concerns    Medication Adjustments/Labs and Tests Ordered: Current medicines are reviewed at length with the patient today.  Concerns regarding medicines are outlined above.  No orders of the defined types were placed in this encounter.  No orders of the defined types were placed in this encounter.    History of Present Illness:    John Weeks is a 65 y.o. male who is being seen today for the evaluation of chest pain at the request of Harvie Junior, MD. patient is a pleasant 65 year old male.  He has past medical history of essential hypertension and dyslipidemia.  He  mentions to me that he experiences chest tightness when he is under stress.  He has orthopedic issues and does not ambulate much.  No chest pain orthopnea or PND otherwise.  He has not used sublingual nitroglycerin.  He is a non-smoker.  At the time of my evaluation, the patient is alert awake oriented and in no distress.  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[place]) 06/21/2015   Bilateral primary osteoarthritis of knee 06/21/2015   Bilateral shoulder pain 10/06/2020   Carpal tunnel syndrome, right 10/06/2020   Chest pain with normal coronary angiography 02/18/2019   Chronic midline low back pain 11/04/2015   Chronic pain syndrome 06/21/2015   Contracture of ankle and foot joint 06/21/2015   DDD (degenerative disc disease), cervical 10/06/2020   DDD (degenerative disc disease), lumbar 06/21/2015   DISH (diffuse idiopathic skeletal hyperostosis) 09/14/2020   DOE (dyspnea on exertion) 08/22/2018   Dyslipidemia 06/21/2015   Essential hypertension 08/02/2015   Foreign body in esophagus 04/18/2017   Formatting of this note might be different from the original. 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   Low testosterone 08/02/2015  Lumbar radiculopathy 06/21/2015   Migraine without aura and without status migrainosus, not intractable 06/21/2015   Mild intermittent asthma without complication 0/34/9179   Mixed hyperlipidemia 08/02/2015   Neck pain 06/21/2015   Osteoarthritis 06/21/2015   Osteoarthrosis, localized, secondary, ankle or foot 06/21/2015   Palpitations 08/22/2018    Peptic ulcer 06/21/2015   Presence of right artificial knee joint 09/19/2017   Primary osteoarthritis of both hips 09/14/2020   Primary osteoarthritis of left ankle 11/04/2015   Primary osteoarthritis of left knee 09/19/2017   Restless leg syndrome 06/21/2015   Rotator cuff impingement syndrome, right 10/06/2020   S/P lumbar spinal fusion 01/11/2020   Tendinitis of left elbow 06/21/2015   Vision problem 06/21/2015   Vitamin D deficiency 08/02/2015    Past Surgical History:  Procedure Laterality Date   ANKLE SURGERY     left ankle   BACK SURGERY     x3   TOTAL KNEE ARTHROPLASTY     right knee     Current Medications: Current Meds  Medication Sig   allopurinol (ZYLOPRIM) 100 MG tablet Take 100 mg by mouth daily.   amLODipine (NORVASC) 10 MG tablet Take 10 mg by mouth daily.   atorvastatin (LIPITOR) 20 MG tablet Take 1 tablet by mouth daily.   azelastine (OPTIVAR) 0.05 % ophthalmic solution Place 1 drop into both eyes daily as needed for allergies.   budesonide-formoterol (SYMBICORT) 160-4.5 MCG/ACT inhaler Inhale 2 puffs into the lungs 2 (two) times daily.   cyclobenzaprine (FLEXERIL) 10 MG tablet Take 10 mg by mouth 3 (three) times daily as needed for muscle spasms.   ferrous sulfate 325 (65 FE) MG tablet Take 325 mg by mouth daily.   fluticasone (FLONASE) 50 MCG/ACT nasal spray Place 2 sprays into both nostrils daily.   gabapentin (NEURONTIN) 300 MG capsule Take 300 mg by mouth 3 (three) times daily.   hydrochlorothiazide (HYDRODIURIL) 25 MG tablet Take 25 mg by mouth every morning.   ibuprofen (ADVIL) 800 MG tablet Take 1 tablet by mouth every 6 (six) hours as needed for pain.   linaclotide (LINZESS) 72 MCG capsule Take 72 mcg by mouth daily as needed for pain.   losartan (COZAAR) 100 MG tablet Take 100 mg by mouth daily.   naloxone (NARCAN) nasal spray 4 mg/0.1 mL Place 1 spray into the nose 3 (three) times daily as needed for opioid reversal.   omeprazole (PRILOSEC) 40 MG capsule  Take 40 mg by mouth daily.   oxyCODONE (ROXICODONE) 15 MG immediate release tablet Take 15 mg by mouth every 6 (six) hours.   oxyCODONE-acetaminophen (PERCOCET) 10-325 MG tablet Take 1 tablet by mouth daily as needed for pain.   sertraline (ZOLOFT) 50 MG tablet Take 50 mg by mouth daily.   tamsulosin (FLOMAX) 0.4 MG CAPS capsule Take 0.4 mg by mouth daily.     Allergies:   Ketorolac and Gabapentin   Social History   Socioeconomic History   Marital status: Divorced    Spouse name: Not on file   Number of children: Not on file   Years of education: Not on file   Highest education level: Not on file  Occupational History   Not on file  Tobacco Use   Smoking status: Never   Smokeless tobacco: Current    Types: Snuff  Substance and Sexual Activity   Alcohol use: Yes    Comment: occasionally   Drug use: No   Sexual activity: Not on file  Other Topics Concern   Not  on file  Social History Narrative   Not on file   Social Determinants of Health   Financial Resource Strain: Not on file  Food Insecurity: Not on file  Transportation Needs: Not on file  Physical Activity: Not on file  Stress: Not on file  Social Connections: Not on file     Family History: The patient's family history includes Diabetes in his brother; Hypertension in his father and mother. There is no history of Heart disease or Cancer.  ROS:   Please see the history of present illness.    All other systems reviewed and are negative.  EKGs/Labs/Other Studies Reviewed:    The following studies were reviewed today: I discussed my findings with the patient at length.  EKG reveals sinus rhythm and nonspecific ST-T changes   Recent Labs: No results found for requested labs within last 8760 hours.  Recent Lipid Panel No results found for: CHOL, TRIG, HDL, CHOLHDL, VLDL, LDLCALC, LDLDIRECT  Physical Exam:    VS:  BP 128/72   Pulse (!) 103   Ht 5\' 8"  (1.727 m)   Wt 198 lb 6.4 oz (90 kg)   SpO2 96%    BMI 30.17 kg/m     Wt Readings from Last 3 Encounters:  01/24/21 198 lb 6.4 oz (90 kg)  06/03/11 215 lb (97.5 kg)     GEN: Patient is in no acute distress HEENT: Normal NECK: No JVD; No carotid bruits LYMPHATICS: No lymphadenopathy CARDIAC: S1 S2 regular, 2/6 systolic murmur at the apex. RESPIRATORY:  Clear to auscultation without rales, wheezing or rhonchi  ABDOMEN: Soft, non-tender, non-distended MUSCULOSKELETAL:  No edema; No deformity  SKIN: Warm and dry NEUROLOGIC:  Alert and oriented x 3 PSYCHIATRIC:  Normal affect    Signed, Jenean Lindau, MD  01/24/2021 3:17 PM    [facility]

## 2021-01-25 ENCOUNTER — Telehealth: Payer: Self-pay

## 2021-01-25 LAB — BASIC METABOLIC PANEL
BUN/Creatinine Ratio: 13 (ref 10–24)
BUN: 9 mg/dL (ref 8–27)
CO2: 25 mmol/L (ref 20–29)
Calcium: 10 mg/dL (ref 8.6–10.2)
Chloride: 96 mmol/L (ref 96–106)
Creatinine, Ser: 0.72 mg/dL — ABNORMAL LOW (ref 0.76–1.27)
Glucose: 82 mg/dL (ref 70–99)
Potassium: 2.9 mmol/L — ABNORMAL LOW (ref 3.5–5.2)
Sodium: 142 mmol/L (ref 134–144)
eGFR: 103 mL/min/{1.73_m2} (ref >59)

## 2021-01-25 NOTE — Telephone Encounter (Signed)
-----   Message from Jenean Lindau, MD sent at 01/25/2021 10:55 AM EDT ----- This needs urgent treatment.  Potassium 40 mEq now by mouth and then again this evening 6 hours from first dose.  Potassium check first thing in the morning.  Copy primary care Jenean Lindau, MD 01/25/2021 10:54 AM

## 2021-01-25 NOTE — Telephone Encounter (Signed)
Left message on patients voicemail to please return our call.   

## 2021-01-26 ENCOUNTER — Telehealth: Payer: Self-pay

## 2021-01-26 NOTE — Telephone Encounter (Signed)
-----   Message from Gita Kudo, RN sent at 01/25/2021  4:18 PM EDT -----  ----- Message ----- From: Jenean Lindau, MD Sent: 01/25/2021  10:55 AM EDT To: Gita Kudo, RN  This needs urgent treatment.  Potassium 40 mEq now by mouth and then again this evening 6 hours from first dose.  Potassium check first thing in the morning.  Copy primary care Jenean Lindau, MD 01/25/2021 10:54 AM

## 2021-01-26 NOTE — Telephone Encounter (Signed)
Left message on patients voicemail to please return our call.   

## 2021-02-01 ENCOUNTER — Ambulatory Visit (HOSPITAL_COMMUNITY): Payer: Medicare HMO

## 2021-02-07 ENCOUNTER — Telehealth (HOSPITAL_COMMUNITY): Payer: Self-pay | Admitting: *Deleted

## 2021-02-07 NOTE — Telephone Encounter (Signed)
Reaching out to patient to offer assistance regarding upcoming cardiac imaging study; pt verbalizes understanding of appt date/time, parking situation and where to check in, pre-test NPO status and medications ordered, and verified current allergies; name and call back number provided for further questions should they arise ° °Lien Lyman RN Navigator Cardiac Imaging °[facility] °[phone] office °[phone] cell  ° °Patient to take 100mg metoprolol tartrate two hours prior to cardiac CT scan. °

## 2021-02-08 ENCOUNTER — Encounter (HOSPITAL_COMMUNITY): Payer: Self-pay | Admitting: Emergency Medicine

## 2021-02-08 ENCOUNTER — Emergency Department (HOSPITAL_COMMUNITY)
Admission: RE | Admit: 2021-02-08 | Discharge: 2021-02-08 | Disposition: A | Discharge status: Home / Self Care | Payer: Medicare HMO | Source: Ambulatory Visit | Attending: Cardiology | Admitting: Cardiology

## 2021-02-08 ENCOUNTER — Encounter (HOSPITAL_COMMUNITY): Payer: Self-pay

## 2021-02-08 ENCOUNTER — Other Ambulatory Visit: Payer: Self-pay

## 2021-02-08 ENCOUNTER — Emergency Department (HOSPITAL_COMMUNITY)
Admission: EM | Admit: 2021-02-08 | Discharge: 2021-02-08 | Disposition: A | Discharge status: Home / Self Care | Payer: Medicare HMO | Attending: Emergency Medicine | Admitting: Emergency Medicine

## 2021-02-08 DIAGNOSIS — I209 Angina pectoris, unspecified: Secondary | ICD-10-CM | POA: Insufficient documentation

## 2021-02-08 DIAGNOSIS — R0609 Other forms of dyspnea: Secondary | ICD-10-CM | POA: Insufficient documentation

## 2021-02-08 DIAGNOSIS — Z79899 Other long term (current) drug therapy: Secondary | ICD-10-CM | POA: Diagnosis not present

## 2021-02-08 DIAGNOSIS — R1010 Upper abdominal pain, unspecified: Secondary | ICD-10-CM | POA: Insufficient documentation

## 2021-02-08 DIAGNOSIS — Z7951 Long term (current) use of inhaled steroids: Secondary | ICD-10-CM | POA: Insufficient documentation

## 2021-02-08 DIAGNOSIS — R001 Bradycardia, unspecified: Secondary | ICD-10-CM | POA: Insufficient documentation

## 2021-02-08 DIAGNOSIS — E039 Hypothyroidism, unspecified: Secondary | ICD-10-CM | POA: Insufficient documentation

## 2021-02-08 DIAGNOSIS — F17290 Nicotine dependence, other tobacco product, uncomplicated: Secondary | ICD-10-CM | POA: Insufficient documentation

## 2021-02-08 DIAGNOSIS — I259 Chronic ischemic heart disease, unspecified: Secondary | ICD-10-CM | POA: Insufficient documentation

## 2021-02-08 DIAGNOSIS — R079 Chest pain, unspecified: Secondary | ICD-10-CM | POA: Diagnosis present

## 2021-02-08 DIAGNOSIS — Z96651 Presence of right artificial knee joint: Secondary | ICD-10-CM | POA: Insufficient documentation

## 2021-02-08 DIAGNOSIS — I10 Essential (primary) hypertension: Secondary | ICD-10-CM | POA: Diagnosis not present

## 2021-02-08 DIAGNOSIS — J4520 Mild intermittent asthma, uncomplicated: Secondary | ICD-10-CM | POA: Diagnosis not present

## 2021-02-08 DIAGNOSIS — I2510 Atherosclerotic heart disease of native coronary artery without angina pectoris: Secondary | ICD-10-CM | POA: Insufficient documentation

## 2021-02-08 DIAGNOSIS — R0602 Shortness of breath: Secondary | ICD-10-CM | POA: Insufficient documentation

## 2021-02-08 DIAGNOSIS — T782XXA Anaphylactic shock, unspecified, initial encounter: Secondary | ICD-10-CM

## 2021-02-08 LAB — TROPONIN I (HIGH SENSITIVITY): Troponin I (High Sensitivity): 7 ng/L (ref <18)

## 2021-02-08 LAB — CBC WITH DIFFERENTIAL/PLATELET
Abs Immature Granulocytes: 0.07 10*3/uL (ref 0.00–0.07)
Basophils Absolute: 0 10*3/uL (ref 0.0–0.1)
Basophils Relative: 0 %
Eosinophils Absolute: 0.1 10*3/uL (ref 0.0–0.5)
Eosinophils Relative: 2 %
HCT: 42.3 % (ref 39.0–52.0)
Hemoglobin: 14.2 g/dL (ref 13.0–17.0)
Immature Granulocytes: 2 %
Lymphocytes Relative: 43 %
Lymphs Abs: 1.9 10*3/uL (ref 0.7–4.0)
MCH: 31.2 pg (ref 26.0–34.0)
MCHC: 33.6 g/dL (ref 30.0–36.0)
MCV: 93 fL (ref 80.0–100.0)
Monocytes Absolute: 0.3 10*3/uL (ref 0.1–1.0)
Monocytes Relative: 6 %
Neutro Abs: 2.2 10*3/uL (ref 1.7–7.7)
Neutrophils Relative %: 47 %
Platelets: 307 10*3/uL (ref 150–400)
RBC: 4.55 MIL/uL (ref 4.22–5.81)
RDW: 11.9 % (ref 11.5–15.5)
WBC: 4.6 10*3/uL (ref 4.0–10.5)
nRBC: 0 % (ref 0.0–0.2)

## 2021-02-08 LAB — BASIC METABOLIC PANEL
Anion gap: 6 (ref 5–15)
BUN: 9 mg/dL (ref 8–23)
CO2: 25 mmol/L (ref 22–32)
Calcium: 8.5 mg/dL — ABNORMAL LOW (ref 8.9–10.3)
Chloride: 106 mmol/L (ref 98–111)
Creatinine, Ser: 0.68 mg/dL (ref 0.61–1.24)
GFR, Estimated: >60 mL/min (ref >60)
Glucose, Bld: 107 mg/dL — ABNORMAL HIGH (ref 70–99)
Potassium: 3.3 mmol/L — ABNORMAL LOW (ref 3.5–5.1)
Sodium: 137 mmol/L (ref 135–145)

## 2021-02-08 LAB — CBG MONITORING, ED: Glucose-Capillary: 83 mg/dL (ref 70–99)

## 2021-02-08 MED ORDER — NITROGLYCERIN 0.4 MG SL SUBL
SUBLINGUAL_TABLET | SUBLINGUAL | Status: AC
  Administered 2021-02-08: 0.8 mg via SUBLINGUAL
  Filled 2021-02-08: qty 2

## 2021-02-08 MED ORDER — DIPHENHYDRAMINE HCL 50 MG/ML IJ SOLN
INTRAMUSCULAR | Status: AC
  Filled 2021-02-08: qty 1

## 2021-02-08 MED ORDER — METHYLPREDNISOLONE SODIUM SUCC 125 MG IJ SOLR
125.0000 mg | Freq: Once | INTRAMUSCULAR | Status: AC
  Administered 2021-02-08: 125 mg via INTRAVENOUS
  Filled 2021-02-08: qty 2

## 2021-02-08 MED ORDER — DIPHENHYDRAMINE HCL 50 MG/ML IJ SOLN
25.0000 mg | Freq: Once | INTRAMUSCULAR | Status: AC
  Administered 2021-02-08: 25 mg via INTRAVENOUS

## 2021-02-08 MED ORDER — FAMOTIDINE IN NACL 20-0.9 MG/50ML-% IV SOLN
20.0000 mg | Freq: Once | INTRAVENOUS | Status: AC
  Administered 2021-02-08: 20 mg via INTRAVENOUS
  Filled 2021-02-08: qty 50

## 2021-02-08 MED ORDER — EPINEPHRINE 1 MG/10ML IJ SOSY
PREFILLED_SYRINGE | INTRAMUSCULAR | Status: AC
  Filled 2021-02-08: qty 10

## 2021-02-08 MED ORDER — EPINEPHRINE 0.3 MG/0.3ML IJ SOAJ: 0.3000 mg | INTRAMUSCULAR | Status: DC | PRN

## 2021-02-08 MED ORDER — IOHEXOL 350 MG/ML SOLN
100.0000 mL | Freq: Once | INTRAVENOUS | Status: AC | PRN
  Administered 2021-02-08: 95 mL via INTRAVENOUS

## 2021-02-08 MED ORDER — SODIUM CHLORIDE 0.9 % IV SOLN
25.0000 mg | INTRAVENOUS | Status: DC | PRN
  Filled 2021-02-08: qty 0.5

## 2021-02-08 MED ORDER — NITROGLYCERIN 0.4 MG SL SUBL: 0.8000 mg | SUBLINGUAL_TABLET | Freq: Once | SUBLINGUAL | Status: AC

## 2021-02-08 NOTE — ED Notes (Signed)
Pt was 94% on RA before he took all equipment off him.

## 2021-02-08 NOTE — Significant Event (Signed)
Rapid Response Event Note   Reason for Call :  Nausea and shortness of breath after contrast given in CT  Initial Focused Assessment:  Patient is lying on the CT table he states his nausea is a little better but is still having mild shortness of breath.  He is diaphoretic and has some epigastric pain/discomfort.  BP 81/54  SR 71  RR 18  O2 sat 99% on NRB Lung sounds are clear. No swelling of lips or tongue.  No facial swelling.  His voice is clear.  Dr Jacqualyn Posey at bedside  Patient is here for out patient cardiac MRI.  Per staff he has had 100mg  Lopressor this am and 2 SL NTG just prior to the scan.  He had and acute change after receiving contrast.     Interventions:  NS bolus Transported to ED for further assessment/treatment 12 lead EKG done Wean O2 to [place]  O2 sats 100%. His face began swelling and he had sonorous respiration, hoarse voice.  Given IV 25mg  Benadryl  20 mg Pepcid 125 mg Solumedrol   Plan of Care:  Care per Dr Joal Fellers off with Arby Barrette RN    Event Summary:   MD Notified: Dr Jacqualyn Posey at bedside Call Time: [place] Time:  1118 End Time:1200  Raliegh Ip, RN

## 2021-02-08 NOTE — Discharge Instructions (Signed)
You should never get IV contrast again.  You need to take 2 Benadryl every 6 hours for the rest of today.  That is an over-the-counter medication.  If you start feeling worse, getting lightheaded, passing out or having any trouble breathing you should return.

## 2021-02-08 NOTE — Progress Notes (Signed)
Patient at radiology department for CT scan. Blood pressure prior to scan 164/90, heart rate 66. Patient denied having current chest pain or shortness of breath prior to scan. Patient given to 0.8 mg of Nitroglycerin per protocol. After receiving IV contrast patient immediately complained of nausea and vomiting. Patient then became diaphoretic and complained of having hard time catching breath, chest pain, and no facial swelling noted. Patient oxygen saturation 98% on room air. Dr. Earleen Newport (Radiologist) called for contrast reaction and rapid response nurse called for further assistance. BP 81/67, Heart 60s, O2 sat 97% on non-rebreather, patient alert/oriented x4. NS IV bolus infusing and patient transported to emergency room for further management. On arrival to ED patient blood pressure 90/60. Bedside report given to ED physician and receiving nurse. Rapid response nurse still at bedside. Patient family member Bruce who accompanied patient to appointment, informed & updated that patient had to be taken to emergency department for contrast reaction. Family member taken to room 7 in ED. Patient eye glasses taken with him to emergency room.

## 2021-02-08 NOTE — ED Notes (Signed)
Pt began removing his monitoring device & pulling out his IV's disregarding the advice to stay and speak to a provider, he was leaving AMA. Another staff member was able to get the pt agree to speak to [place] before he left. EDP Punnett has spoken all d/c instructions to pt & this RN has gave him d/c papers.

## 2021-02-08 NOTE — ED Provider Notes (Signed)
[facility] EMERGENCY DEPARTMENT Provider Note   CSN: [id number] Arrival date & time: 02/08/21  1127     History No chief complaint on file.   John Weeks is a 65 y.o. male.  Patient is a 65 year old male with a history of hypothyroidism, recent recurrent chest pain, hypertension, persistent right knee pain who is presenting today from the radiology department after a sudden response after receiving IV contrast.  Patient was there to get a coronary CT for further evaluation of chest pain that he has been having recently.  He reports when he got to the hospital he was feeling fine except for a little short of breath because he had to walk around for a while to find where he was supposed to be going.  He had taken metoprolol prior to arrival.  In radiology they gave patient 2 nitroglycerin and further metoprolol for this scan.  As soon as they injected the contrast patient started complaining of feeling nauseated, abdominal pain and diaphoresis with some mild shortness of breath.  They noted he had blood pressures in the 81E and 75T systolic.  They placed him in Trendelenburg and put a nonrebreather on him.  He denied any itching and no rashes were noted.  No prior history of any contrast allergy.  He was brought to the emergency room for further care.  Currently he reports still having pain in his upper abdomen and some mild shortness of breath but it feels like it is getting better.  He does not feel like there is any thickness or swelling in his throat but notes that his voice does sound different and radiology physician and nurse note that his face is swollen compared to when he first arrived to the hospital.  The history is provided by the patient and medical records (another physician).      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[place]) 06/21/2015   Bilateral primary osteoarthritis of knee 06/21/2015   Bilateral shoulder pain 10/06/2020   Carpal tunnel syndrome, right 10/06/2020   Chest pain with normal coronary angiography 02/18/2019   Chronic midline low back pain 11/04/2015   Chronic pain syndrome 06/21/2015   Contracture of ankle and foot joint 06/21/2015   DDD (degenerative disc disease), cervical 10/06/2020   DDD (degenerative disc disease), lumbar 06/21/2015   DISH (diffuse idiopathic skeletal hyperostosis) 09/14/2020   DOE (dyspnea on exertion) 08/22/2018   Dyslipidemia 06/21/2015   Essential hypertension 08/02/2015   Foreign body in esophagus 04/18/2017   Formatting of this note might be different from the original. 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   Low testosterone 08/02/2015   Lumbar radiculopathy 06/21/2015   Migraine without aura and without status migrainosus, not intractable 06/21/2015   Mild intermittent asthma without complication 9/44/9675   Mixed hyperlipidemia 08/02/2015   Neck pain 06/21/2015   Osteoarthritis 06/21/2015   Osteoarthrosis, localized, secondary, ankle or foot 06/21/2015   Palpitations 08/22/2018   Peptic ulcer 06/21/2015   Presence of right artificial knee joint 09/19/2017   Primary osteoarthritis of both hips  09/14/2020   Primary osteoarthritis of left ankle 11/04/2015   Primary osteoarthritis of left knee 09/19/2017   Restless leg syndrome 06/21/2015   Rotator cuff impingement syndrome, right 10/06/2020   S/P lumbar spinal fusion 01/11/2020   Tendinitis of left elbow 06/21/2015   Vision problem 06/21/2015   Vitamin D deficiency 08/02/2015    Patient Active Problem List   Diagnosis  Date Noted   Angina pectoris ([place]) 01/24/2021   Obesity (BMI 30.0-34.9) 01/24/2021   Hyperlipemia 01/19/2021   Hypertension 01/19/2021   Bilateral shoulder pain 10/06/2020   Carpal tunnel syndrome, right 10/06/2020   DDD (degenerative disc disease), cervical 10/06/2020   Rotator cuff impingement syndrome, right 10/06/2020   DISH (diffuse idiopathic skeletal hyperostosis) 09/14/2020   Primary osteoarthritis of both hips 09/14/2020   S/P lumbar spinal fusion 01/11/2020   Chest pain with normal coronary angiography 02/18/2019   Abnormal myocardial perfusion study 09/29/2018   DOE (dyspnea on exertion) 08/22/2018   Palpitations 08/22/2018   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   Presence of right artificial knee joint 09/19/2017   Primary osteoarthritis of left knee 09/19/2017   Foreign body in esophagus 04/18/2017   Chronic midline low back pain 11/04/2015   Gout 11/04/2015   History of total right knee replacement 11/04/2015   History of traumatic rupture of spleen 11/04/2015   Mild intermittent asthma without complication 35/57/3220   Primary osteoarthritis of left ankle 11/04/2015   Acquired hypothyroidism 08/02/2015   Essential hypertension 08/02/2015   Low testosterone 08/02/2015   Mixed hyperlipidemia 08/02/2015   Vitamin D deficiency 08/02/2015   Allergic conjunctivitis of both eyes 06/21/2015   Allergic rhinitis 06/21/2015   Bilateral primary osteoarthritis of knee 06/21/2015   Chronic pain syndrome 06/21/2015   Contracture of ankle and foot joint 06/21/2015   DDD (degenerative disc disease), lumbar 06/21/2015   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[place]) 06/21/2015   Osteoarthritis 06/21/2015   Dyslipidemia 06/21/2015   Lumbar radiculopathy 06/21/2015   Migraine without aura and without status migrainosus, not intractable 06/21/2015   Neck pain 06/21/2015   Osteoarthrosis, localized, secondary, ankle or foot 06/21/2015   Peptic  ulcer 06/21/2015   Restless leg syndrome 06/21/2015   Tendinitis of left elbow 06/21/2015   Vision problem 06/21/2015    Past Surgical History:  Procedure Laterality Date   ANKLE SURGERY     left ankle   BACK SURGERY     x3   TOTAL KNEE ARTHROPLASTY     right knee        Family History  Problem Relation Age of Onset   Hypertension Mother    Hypertension Father    Diabetes Brother    Heart disease Neg Hx    Cancer Neg Hx     Social History   Tobacco Use   Smoking status: Never   Smokeless tobacco: Current    Types: Snuff  Substance Use Topics   Alcohol use: Yes    Comment: occasionally   Drug use: No    Home Medications Prior to Admission medications   Medication Sig Start Date End Date Taking? Authorizing Provider  allopurinol (ZYLOPRIM) 100 MG tablet Take 100 mg by mouth daily. 09/18/18   [provider]  amLODipine (NORVASC) 10 MG tablet Take 10 mg by mouth daily. 12/28/20   [provider]  atorvastatin (LIPITOR) 20 MG tablet Take 1 tablet by mouth daily. 02/15/16   [provider]  azelastine (OPTIVAR) 0.05 % ophthalmic solution Place  1 drop into both eyes daily as needed for allergies. 09/07/20   [provider]  budesonide-formoterol (SYMBICORT) 160-4.5 MCG/ACT inhaler Inhale 2 puffs into the lungs 2 (two) times daily.    [provider]  cyclobenzaprine (FLEXERIL) 10 MG tablet Take 10 mg by mouth 3 (three) times daily as needed for muscle spasms. 06/16/20   [provider]  ferrous sulfate 325 (65 FE) MG tablet Take 325 mg by mouth daily.    [provider]  fluticasone (FLONASE) 50 MCG/ACT nasal spray Place 2 sprays into both nostrils daily. 04/27/16   [provider]  gabapentin (NEURONTIN) 300 MG capsule Take 300 mg by mouth 3 (three) times daily. 01/11/21   [provider]  hydrochlorothiazide (HYDRODIURIL) 25 MG tablet Take 25 mg by mouth every morning. 12/28/20   [provider]  ibuprofen (ADVIL) 800 MG tablet Take 1 tablet by mouth every 6 (six) hours as needed for pain. 05/17/16   [provider]  linaclotide (LINZESS) 72 MCG capsule Take 72 mcg by mouth daily as needed for pain.    [provider]  losartan (COZAAR) 100 MG tablet Take 100 mg by mouth daily. 04/07/20   [provider]  metoprolol tartrate (LOPRESSOR) 100 MG tablet Take 1 tablet (100 mg total) by mouth once for 1 dose. Take 2 hours prior to your CT if your heart rate is greater than 55 01/24/21 01/24/21  Revankar, Reita Cliche, MD  naloxone [facility] nasal spray 4 mg/0.1 mL Place 1 spray into the nose 3 (three) times daily as needed for opioid reversal. 05/16/20   [provider]  nitroGLYCERIN (NITROSTAT) 0.4 MG SL tablet Place 1 tablet (0.4 mg total) under the tongue every 5 (five) minutes as needed. 01/24/21 04/24/21  Revankar, Reita Cliche, MD  omeprazole (PRILOSEC) 40 MG capsule Take 40 mg by mouth daily. 04/08/20   [provider]  oxyCODONE (ROXICODONE) 15 MG immediate release tablet Take 15 mg by mouth every 6 (six) hours. 12/28/20   [provider]  oxyCODONE-acetaminophen (PERCOCET) 10-325 MG tablet Take 1 tablet by mouth daily as needed for pain. 09/11/17   [provider]  sertraline (ZOLOFT) 50 MG tablet Take 50 mg by mouth daily. 11/30/20   [provider]  tamsulosin (FLOMAX) 0.4 MG CAPS capsule Take 0.4 mg by mouth daily. 11/23/20   [provider]    Allergies    Contrast media [iodinated diagnostic agents], Ketorolac, and Gabapentin  Review of Systems   Review of Systems  All other systems reviewed and are negative.  Physical Exam Updated Vital Signs BP (!) 158/91   Pulse (!) 52   Temp (!) 97.3 F (36.3 C) (Oral)   Resp 17   Ht 5\' 8"  (1.727 m)   Wt 95.3 kg   SpO2 100%   BMI 31.93 kg/m   Physical Exam Vitals and nursing note reviewed.  Constitutional:      Appearance: He is well-developed. He is  ill-appearing and diaphoretic.  HENT:     Head: Normocephalic and atraumatic.     Mouth/Throat:     Mouth: Mucous membranes are moist.     Comments: No notable tongue or pharyngeal swelling Eyes:     Conjunctiva/sclera: Conjunctivae normal.     Pupils: Pupils are equal, round, and reactive to light.  Cardiovascular:     Rate and Rhythm: Regular rhythm. Bradycardia present.     Heart sounds: No murmur heard. Pulmonary:     Effort: Pulmonary  effort is normal. No respiratory distress.     Breath sounds: Normal breath sounds. No wheezing or rales.  Abdominal:     General: There is no distension.     Palpations: Abdomen is soft.     Tenderness: There is no abdominal tenderness. There is no guarding or rebound.  Musculoskeletal:        General: No tenderness. Normal range of motion.     Cervical back: Normal range of motion and neck supple.  Skin:    General: Skin is warm.     Findings: No erythema or rash.     Comments: No urticaria noted  Neurological:     Mental Status: He is alert and oriented to person, place, and time. Mental status is at baseline.  Psychiatric:        Mood and Affect: Mood normal.        Behavior: Behavior normal.    ED Results / Procedures / Treatments   Labs (all labs ordered are listed, but only abnormal results are displayed) Labs Reviewed  BASIC METABOLIC PANEL - Abnormal; Notable for the following components:      Result Value   Potassium 3.3 (*)    Glucose, Bld 107 (*)    Calcium 8.5 (*)    All other components within normal limits  CBC WITH DIFFERENTIAL/PLATELET  CBG MONITORING, ED  TROPONIN I (HIGH SENSITIVITY)  TROPONIN I (HIGH SENSITIVITY)    EKG EKG Interpretation  Date/Time:  Wednesday February 08 2021 12:14:37 EDT Ventricular Rate:  59 PR Interval:  172 QRS Duration: 93 QT Interval:  461 QTC Calculation: 457 R Axis:   27 Text Interpretation: Sinus rhythm Sinus pause No significant change since last tracing Confirmed by  Blanchie Dessert [phone]) on 02/08/2021 1:21:40 PM  Radiology No results found.  Procedures Procedures   Medications Ordered in ED Medications  EPINEPHrine (EPI-PEN) injection 0.3 mg (has no administration in time range)  methylPREDNISolone sodium succinate (SOLU-MEDROL) 125 mg/2 mL injection 125 mg (125 mg Intravenous Given 02/08/21 1144)  famotidine (PEPCID) IVPB 20 mg premix (0 mg Intravenous Stopped 02/08/21 1217)  diphenhydrAMINE (BENADRYL) injection 25 mg (25 mg Intravenous Given 02/08/21 1141)    ED Course  I have reviewed the triage vital signs and the nursing notes.  Pertinent labs & imaging results that were available during my care of the patient were reviewed by me and considered in my medical decision making (see chart for details).    MDM Rules/Calculators/A&P                           Patient presenting today from the radiology department with symptoms most concerning for allergic reaction and anaphylaxis to contrast dye.  Patient was given nitroglycerin and metoprolol before his coronary CT which may be adding into his hypotension.  It did resolve with IV fluids but he continued to have swelling of his face and mild change in his voice.  Oxygen saturation remained above 90% but is on 4 L.  It did drop initially upon arrival to the emergency department but patient was lying flat and in Trendelenburg.  He reports he has not been able to lie flat for some time.  Breath sounds are clear no evidence of hives or rash.  However given the notable swelling to his face which is new suspect contrast dye allergy.  He was given Solu-Medrol, Benadryl and Pepcid.  Held on epi unless there were worsening symptoms as he  has unknown cardiac issues going on which was why he was getting the scan.  1:41 PM Patient's labs are reassuring.  He is feeling much better now that the medications have kicked in.  Patient removed all of his leads and got up and walked up to the front desk reporting he  was ready to go.  On reevaluation voice is normal.  Sats are normal on room air.  Patient is walking around and in no acute distress.  Recommended he take Benadryl every 6 hours for the rest of today.  Return precautions.  He will follow-up with his doctor about his coronary CT results.  MDM   Amount and/or Complexity of Data Reviewed Clinical lab tests: ordered and reviewed Tests in the medicine section of CPT: ordered and reviewed Independent visualization of images, tracings, or specimens: yes     Final Clinical Impression(s) / ED Diagnoses Final diagnoses:  None    Rx / DC Orders ED Discharge Orders     None        Blanchie Dessert, MD 02/08/21 1342

## 2021-02-08 NOTE — ED Triage Notes (Signed)
Pt bib from outpt CT c/o CP with nausea and SOB after receiving contrast dye.  98% RA, now 88% NRB, BP was 160/?, after CP BP 81/47. Pt received 2 SL NTG before receiving dye.  Pt AOx4, reports CP imrpoving. Pt on NRB

## 2021-02-08 NOTE — ED Notes (Signed)
Pt was very unhappy that he needed to wait to get something to drink, he was explained why d/t his having an allergic reaction.

## 2021-02-14 ENCOUNTER — Other Ambulatory Visit: Payer: Self-pay

## 2021-02-14 ENCOUNTER — Ambulatory Visit (INDEPENDENT_AMBULATORY_CARE_PROVIDER_SITE_OTHER): Payer: Medicare HMO

## 2021-02-14 DIAGNOSIS — I209 Angina pectoris, unspecified: Secondary | ICD-10-CM | POA: Diagnosis not present

## 2021-02-14 DIAGNOSIS — R0609 Other forms of dyspnea: Secondary | ICD-10-CM | POA: Diagnosis not present

## 2021-02-14 LAB — ECHOCARDIOGRAM COMPLETE
Area-P 1/2: 4.04 cm2
S' Lateral: 2.5 cm

## 2021-03-13 ENCOUNTER — Telehealth: Payer: Self-pay | Admitting: Cardiology

## 2021-03-13 NOTE — Telephone Encounter (Signed)
Records sent via EPIC

## 2021-03-13 NOTE — Telephone Encounter (Signed)
Patient's PCP is calling requesting the OV notes from Antione's NP visit as well as his Echo results be faxed to their office. Fax number is [phone].

## 2021-08-23 ENCOUNTER — Ambulatory Visit: Payer: Medicare HMO | Admitting: Cardiology

## 2021-08-24 DIAGNOSIS — K223 Perforation of esophagus: Secondary | ICD-10-CM

## 2021-08-24 DIAGNOSIS — F1120 Opioid dependence, uncomplicated: Secondary | ICD-10-CM

## 2021-08-24 DIAGNOSIS — K219 Gastro-esophageal reflux disease without esophagitis: Secondary | ICD-10-CM

## 2021-08-24 HISTORY — DX: Gastro-esophageal reflux disease without esophagitis: K21.9

## 2021-08-24 HISTORY — DX: Opioid dependence, uncomplicated: F11.20

## 2021-08-24 HISTORY — DX: Perforation of esophagus: K22.3

## 2021-08-30 DIAGNOSIS — R251 Tremor, unspecified: Secondary | ICD-10-CM | POA: Insufficient documentation

## 2021-08-30 HISTORY — DX: Tremor, unspecified: R25.1

## 2021-09-06 ENCOUNTER — Ambulatory Visit: Payer: Medicare HMO | Admitting: Cardiology

## 2021-11-02 ENCOUNTER — Ambulatory Visit: Payer: Medicare HMO | Admitting: Cardiology

## 2021-11-08 ENCOUNTER — Ambulatory Visit: Payer: Medicare HMO | Admitting: Cardiology

## 2021-11-24 ENCOUNTER — Ambulatory Visit: Payer: Medicare HMO | Admitting: Cardiology

## 2022-07-17 ENCOUNTER — Emergency Department (HOSPITAL_BASED_OUTPATIENT_CLINIC_OR_DEPARTMENT_OTHER): Payer: Medicare HMO

## 2022-07-17 ENCOUNTER — Other Ambulatory Visit: Payer: Self-pay

## 2022-07-17 ENCOUNTER — Emergency Department (HOSPITAL_BASED_OUTPATIENT_CLINIC_OR_DEPARTMENT_OTHER)
Admission: EM | Admit: 2022-07-17 | Discharge: 2022-07-17 | Disposition: A | Discharge status: Home / Self Care | Payer: Medicare HMO | Attending: Emergency Medicine | Admitting: Emergency Medicine

## 2022-07-17 DIAGNOSIS — E039 Hypothyroidism, unspecified: Secondary | ICD-10-CM | POA: Diagnosis not present

## 2022-07-17 DIAGNOSIS — Z96651 Presence of right artificial knee joint: Secondary | ICD-10-CM | POA: Diagnosis not present

## 2022-07-17 DIAGNOSIS — M25511 Pain in right shoulder: Secondary | ICD-10-CM | POA: Insufficient documentation

## 2022-07-17 DIAGNOSIS — G8929 Other chronic pain: Secondary | ICD-10-CM | POA: Diagnosis not present

## 2022-07-17 DIAGNOSIS — Z79899 Other long term (current) drug therapy: Secondary | ICD-10-CM | POA: Diagnosis not present

## 2022-07-17 DIAGNOSIS — M25512 Pain in left shoulder: Secondary | ICD-10-CM | POA: Insufficient documentation

## 2022-07-17 DIAGNOSIS — I10 Essential (primary) hypertension: Secondary | ICD-10-CM | POA: Diagnosis not present

## 2022-07-17 MED ORDER — HYDROMORPHONE HCL 1 MG/ML IJ SOLN
1.0000 mg | Freq: Once | INTRAMUSCULAR | Status: DC
  Filled 2022-07-17: qty 1

## 2022-07-17 MED ORDER — OXYCODONE-ACETAMINOPHEN 5-325 MG PO TABS: 1.0000 | ORAL_TABLET | Freq: Four times a day (QID) | ORAL | 0 refills | Status: AC | PRN

## 2022-07-17 NOTE — Discharge Instructions (Addendum)
Please take your medications as prescribed. I recommend close follow-up with orthopedics for reevaluation.  Please do not hesitate to return to emergency department if worrisome signs symptoms we discussed become apparent.

## 2022-07-17 NOTE — ED Triage Notes (Signed)
Patient presents to ED via POV from home. Here with bilateral shoulder pain. Right > Left. Denies recent injury or trauma. Chronic pain.

## 2022-07-18 NOTE — ED Provider Notes (Signed)
[facility] Provider Note   CSN: [id number] Arrival date & time: 07/17/22  1058     History  Chief Complaint  Patient presents with   Shoulder Pain    John Weeks is a 67 y.o. male with a past medical history as below present today for evaluation of shoulder pain.  Patient does have history of bilateral shoulder pain.  Patient states he is recovering from a R rotator cuff repair last year and septic arthritis of the left shoulder after getting cortisone injection.  States the pain has gotten worse in the last few days that has not resolved with Tylenol and oxycodone.  States the pain is worse with movement and it is hard for him to find comfortable position.  Denies any swelling to his shoulders.  Denies any fever, nausea, vomiting, chest pain, shortness of breath.     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[place]) 06/21/2015   Bilateral primary osteoarthritis of knee 06/21/2015   Bilateral shoulder pain 10/06/2020   Carpal tunnel syndrome, right 10/06/2020   Chest pain with normal coronary angiography 02/18/2019   Chronic midline low back pain 11/04/2015   Chronic pain syndrome 06/21/2015   Contracture of ankle and foot joint 06/21/2015   DDD (degenerative disc disease), cervical 10/06/2020   DDD (degenerative disc disease), lumbar 06/21/2015   DISH (diffuse idiopathic skeletal hyperostosis) 09/14/2020   DOE (dyspnea on exertion) 08/22/2018   Dyslipidemia 06/21/2015   Esophageal perforation 08/24/2021   Essential hypertension 08/02/2015   Foreign body in esophagus 04/18/2017   Formatting of this note might be different from the original. 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/2017   Low testosterone 08/02/2015   Lumbar radiculopathy 06/21/2015   Migraine without aura and without status migrainosus, not intractable 06/21/2015   Mild intermittent asthma without complication [id number]   Mixed hyperlipidemia 08/02/2015   Neck pain 06/21/2015   Obesity (BMI 30.0-34.9) 01/24/2021   Opioid dependence ([place]) 08/24/2021   Osteoarthritis 06/21/2015   Osteoarthrosis, localized, secondary, ankle or foot 06/21/2015   Palpitations 08/22/2018   Peptic ulcer 06/21/2015   Presence of right artificial knee joint 09/19/2017   Primary hypertension 01/19/2021   Primary osteoarthritis of both hips 09/14/2020   Primary osteoarthritis of left ankle 11/04/2015   Primary osteoarthritis of left knee 09/19/2017   Restless leg syndrome 06/21/2015   Rotator cuff impingement syndrome, right 10/06/2020   S/P lumbar spinal fusion 01/11/2020   Tendinitis of left elbow 06/21/2015   Tremor 08/30/2021   Vision problem 06/21/2015   Vitamin D deficiency 08/02/2015   Past Surgical History:  Procedure Laterality Date   ANKLE SURGERY     left ankle   BACK SURGERY     x3   TOTAL KNEE ARTHROPLASTY     right knee      Home Medications Prior to Admission medications   Medication Sig Start Date End Date Taking? Authorizing Provider  oxyCODONE-acetaminophen (  PERCOCET/ROXICET) 5-325 MG tablet Take 1 tablet by mouth every 6 (six) hours as needed for severe pain. 07/17/22  Yes Rex Kras, PA  allopurinol (ZYLOPRIM) 100 MG tablet Take 100 mg by mouth daily. 09/18/18   [provider]  amLODipine (NORVASC) 10 MG tablet Take 10 mg by mouth daily. 12/28/20   [provider]  atorvastatin (LIPITOR) 20 MG tablet Take 1 tablet by mouth  daily. 02/15/16   [provider]  azelastine (OPTIVAR) 0.05 % ophthalmic solution Place 1 drop into both eyes daily as needed for allergies. 09/07/20   [provider]  budesonide-formoterol (SYMBICORT) 160-4.5 MCG/ACT inhaler Inhale 2 puffs into the lungs 2 (two) times daily.    [provider]  cyclobenzaprine (FLEXERIL) 10 MG tablet Take 10 mg by mouth 3 (three) times daily as needed for muscle spasms. 06/16/20   [provider]  ferrous sulfate 325 (65 FE) MG tablet Take 325 mg by mouth daily.    [provider]  fluticasone (FLONASE) 50 MCG/ACT nasal spray Place 2 sprays into both nostrils daily. 04/27/16   [provider]  gabapentin (NEURONTIN) 300 MG capsule Take 300 mg by mouth 3 (three) times daily. 01/11/21   [provider]  hydrochlorothiazide (HYDRODIURIL) 25 MG tablet Take 25 mg by mouth every morning. 12/28/20   [provider]  ibuprofen (ADVIL) 800 MG tablet Take 1 tablet by mouth every 6 (six) hours as needed for pain. 05/17/16   [provider]  linaclotide (LINZESS) 72 MCG capsule Take 72 mcg by mouth daily as needed for pain.    [provider]  losartan (COZAAR) 100 MG tablet Take 100 mg by mouth daily. 04/07/20   [provider]  metoprolol tartrate (LOPRESSOR) 100 MG tablet Take 1 tablet (100 mg total) by mouth once for 1 dose. Take 2 hours prior to your CT if your heart rate is greater than 55 01/24/21 01/24/21  Revankar, Reita Cliche, MD  naloxone [facility]) nasal spray 4 mg/0.1 mL Place 1 spray into the nose 3 (three) times daily as needed for opioid reversal. 05/16/20   [provider]  nitroGLYCERIN (NITROSTAT) 0.4 MG SL tablet Place 1 tablet (0.4 mg total) under the tongue every 5 (five) minutes as needed. 01/24/21 04/24/21  Revankar, Reita Cliche, MD  omeprazole (PRILOSEC) 40 MG capsule Take 40 mg by mouth daily. 04/08/20   [provider]  oxyCODONE (ROXICODONE) 15 MG immediate  release tablet Take 15 mg by mouth every 6 (six) hours. 12/28/20   [provider]  oxyCODONE-acetaminophen (PERCOCET) 10-325 MG tablet Take 1 tablet by mouth daily as needed for pain. 09/11/17   [provider]  sertraline (ZOLOFT) 50 MG tablet Take 50 mg by mouth daily. 11/30/20   [provider]  tamsulosin (FLOMAX) 0.4 MG CAPS capsule Take 0.4 mg by mouth daily. 11/23/20   [provider]      Allergies    Contrast media [iodinated contrast media], Ketorolac, and Gabapentin    Review of Systems   Review of Systems Negative except as per HPI.  Physical Exam Updated Vital Signs BP (!) 172/93 (BP Location: Left Arm)   Pulse 86   Temp 98.1 F (36.7 C) (Oral)   Resp 17   Ht 5\' 8"  (1.727 m)   Wt 94.3 kg   SpO2 100%   BMI 31.63 kg/m  Physical Exam Vitals and nursing note reviewed.  Constitutional:      Appearance: Normal appearance.  HENT:  Head: Normocephalic and atraumatic.     Mouth/Throat:     Mouth: Mucous membranes are moist.  Eyes:     General: No scleral icterus. Cardiovascular:     Rate and Rhythm: Normal rate and regular rhythm.     Pulses: Normal pulses.     Heart sounds: Normal heart sounds.  Pulmonary:     Effort: Pulmonary effort is normal.     Breath sounds: Normal breath sounds.  Abdominal:     General: Abdomen is flat.     Palpations: Abdomen is soft.     Tenderness: There is no abdominal tenderness.  Musculoskeletal:        General: No deformity.     Comments: Tenderness to palpation to left and right shoulders.  No swelling noticed.  Skin is cool to touch.  Limited range of motion due to pain.  Skin:    General: Skin is warm.     Findings: No rash.  Neurological:     General: No focal deficit present.     Mental Status: He is alert.  Psychiatric:        Mood and Affect: Mood normal.     ED Results / Procedures / Treatments   Labs (all labs ordered are listed, but only abnormal results are displayed) Labs  Reviewed - No data to display  EKG None  Radiology DG Shoulder Right  Result Date: 07/17/2022 CLINICAL DATA:  Bilateral shoulder pain right worse than left. EXAM: RIGHT SHOULDER - 2+ VIEW COMPARISON:  None FINDINGS: Mild degenerative spurring of the glenohumeral joint. Normal humeral acromial distance. Advanced degenerative arthritis of the [facility] joint which could be painful. IMPRESSION: Advanced degenerative arthritis of the [facility] joint which could be painful. Mild degenerative spurring of the glenohumeral joint. Electronically Signed   By: Nelson Chimes M.D.   On: 07/17/2022 12:42   DG Shoulder Left  Result Date: 07/17/2022 CLINICAL DATA:  Bilateral shoulder pain right worse than left EXAM: LEFT SHOULDER - 2+ VIEW COMPARISON:  None Available. FINDINGS: Mild osteoarthritis of the glenohumeral joint. Normal humeral acromial distance. Pronounced degenerative arthritis of the [facility] joint. No acute finding. IMPRESSION: Osteoarthritis of the glenohumeral joint and particularly the AC joint. Electronically Signed   By: Nelson Chimes M.D.   On: 07/17/2022 12:41    Procedures Procedures    Medications Ordered in ED Medications - No data to display  ED Course/ Medical Decision Making/ A&P                             Medical Decision Making Amount and/or Complexity of Data Reviewed Radiology: ordered.  Risk Prescription drug management.   This patient presents to the ED for shoulder pain, this involves an extensive number of treatment options, and is a complaint that carries with a high risk of complications and morbidity.  The differential diagnosis includes arthritis, fracture, dislocation, septic joint or musculoskeletal pain.  This is not an exhaustive list.  Imaging studies: I ordered imaging studies. I personally reviewed, interpreted imaging and agree with the radiologist's interpretations. The results include: X-ray of the shoulders with no acute changes.  Problem list/ ED course/ Critical  interventions/ Medical management: HPI: See above Vital signs within normal range and stable throughout visit. Laboratory/imaging studies significant for: See above. On physical examination, patient is afebrile and appears in no acute distress. Patient presents with bilateral shoulder joint pain. Given history, exam and workup patient likely has arthritis. I  have low suspicion for fracture, dislocation, significant ligamentous injury, septic arthritis, gout flare, new autoimmune arthropathy, or gonococcal arthropathy.  Shoulder pain seem to be chronic in nature.  Goal is to treat symptomatically.  Patient was given Dilaudid here in the emergency department. Reevaluation of the patient after these medications showed that the patient improved.  I sent an Rx of oxycodone for pain.  Advised patient to take Tylenol/ibuprofen or oxycodone as needed for pain, follow-up with Ortho for further evaluation and management and return to ER if new or worsening symptoms. I have reviewed the patient home medicines and have made adjustments as needed.  Cardiac monitoring/EKG: The patient was maintained on a cardiac monitor.  I personally reviewed and interpreted the cardiac monitor which showed an underlying rhythm of: sinus rhythm.  Additional history obtained: External records from outside source obtained and reviewed including: Chart review including previous notes, labs, imaging.  Consultations obtained:  Disposition Continued outpatient therapy. Follow-up with Ortho recommended for reevaluation of symptoms. Treatment plan discussed with patient.  Pt acknowledged understanding was agreeable to the plan. Worrisome signs and symptoms were discussed with patient, and patient acknowledged understanding to return to the ED if they noticed these signs and symptoms. Patient was stable upon discharge.   This chart was dictated using voice recognition software.  Despite best efforts to proofread,  errors can occur which  can change the documentation meaning.          Final Clinical Impression(s) / ED Diagnoses Final diagnoses:  Chronic pain of both shoulders    Rx / DC Orders ED Discharge Orders          Ordered    oxyCODONE-acetaminophen (PERCOCET/ROXICET) 5-325 MG tablet  Every 6 hours PRN        07/17/22 1330              Rex Kras, [place] 07/18/22 1307    Malvin Johns, MD 07/18/22 1550

## 2022-11-18 IMAGING — CT CT HEART MORP W/ CTA COR W/ SCORE W/ CA W/CM &/OR W/O CM
4 of 7 series · 8 of 20 positions shown, 9 images · IV contrast (APPLIED)
Comparison: None.
COMPARISON: None.

Addendum:
EXAM:
OVER-READ INTERPRETATION  CT CHEST

The following report is an over-read performed by radiologist Dr.
Bamboom Bansal [place] [REDACTED] on 02/08/2021. This
over-read does not include interpretation of cardiac or coronary
anatomy or pathology. The coronary calcium score/coronary CTA
interpretation by the cardiologist is attached.
HISTORY: 65 yo male with chest pain, nonspecific Dyspnea on exertion ([place])
Cardiac/Coronary CTA
TECHNIQUE: The patient was scanned on a Siemens Force scanner.
PROTOCOL: A 100 kV prospective scan was triggered in the descending thoracic
aorta at 111 HU's. Axial non-contrast 3 mm slices were carried out
through the heart. The data set was analyzed on a dedicated work
station and scored using the Agatson method. Gantry rotation speed
was 250 msecs and collimation was .6 mm. Beta blockade and 0.8 mg of
sl NTG was given. The 3D data set was reconstructed in 5% intervals
of the 35-75 % of the R-R cycle. Diastolic phases were analyzed on a
dedicated work station using MPR, MIP and VRT modes. The patient
received 95mL OMNIPAQUE IOHEXOL 350 MG/ML SOLN of contrast.

[Series 6: best diast 77 % · axial · 0.38mm/px · z∈[-40,+5]mm · 2 of 333 slices shown]
[im 111/333  vessel]
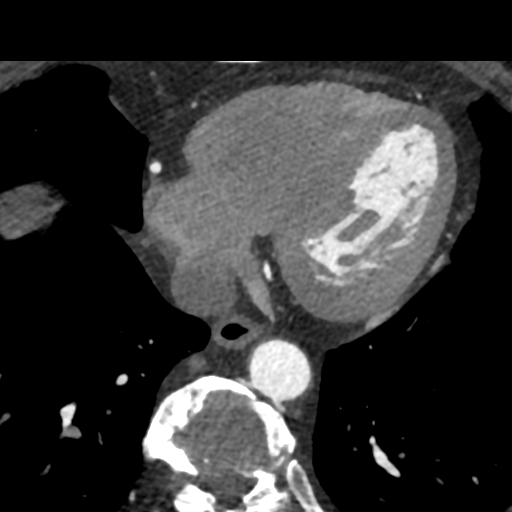
[im 222/333  vessel]
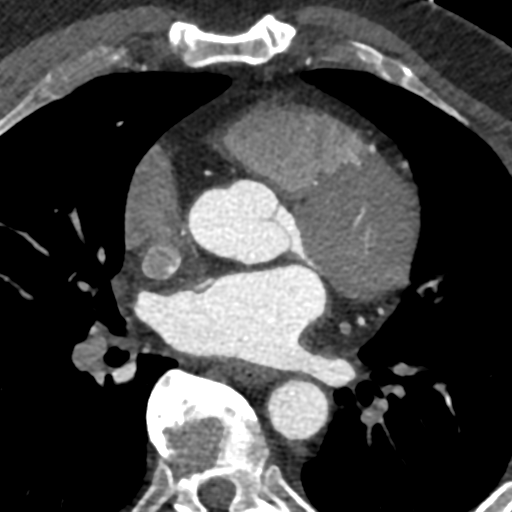

[Series 7: best syst · axial · 0.38mm/px · z∈[-40,+5]mm · 2 of 333 slices shown, 3 images]
[im 111/333  vessel]
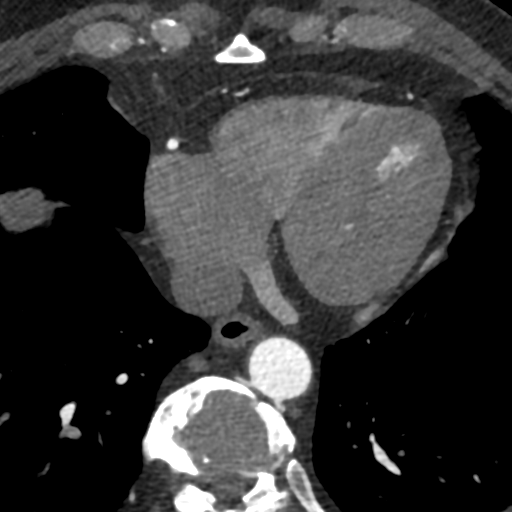
[im 111/333  lung]
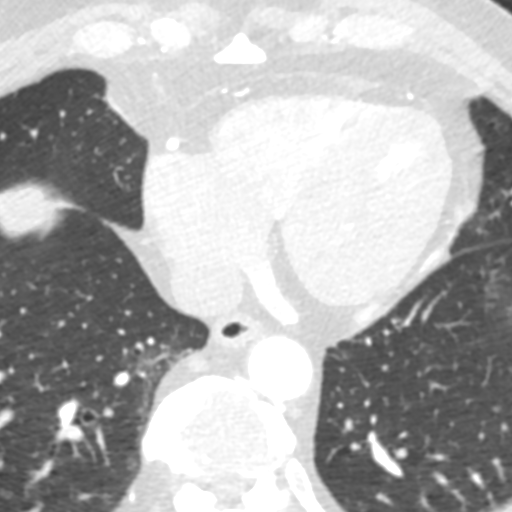
[im 222/333  vessel]
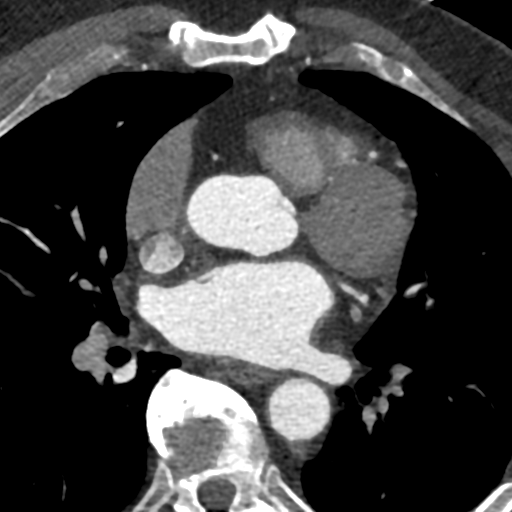

[Series 8: ts diast sharp · axial · 0.38mm/px · z∈[-40,+5]mm · 2 of 333 slices shown]
[im 111/333  lung]
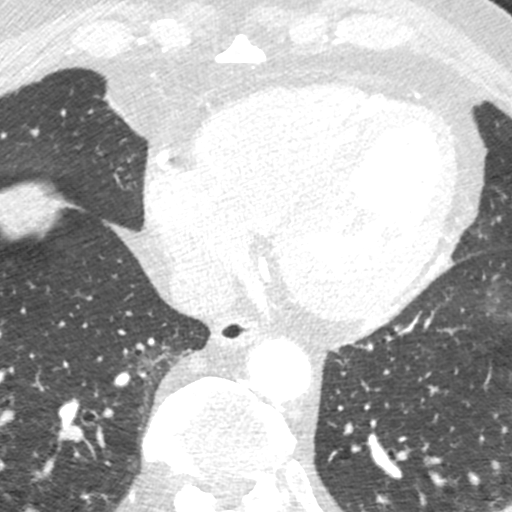
[im 222/333  lung]
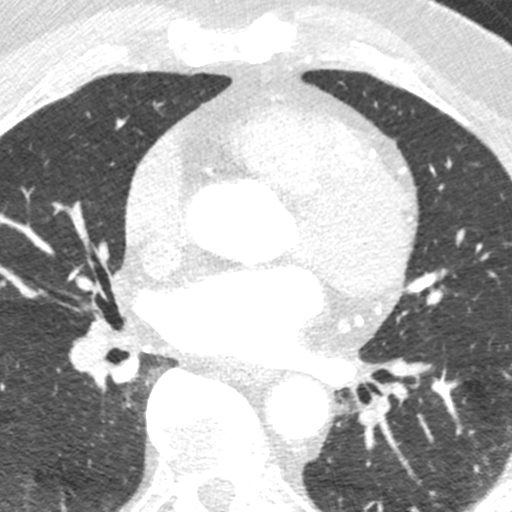

[Series 9: ts syst sharp · axial · 0.38mm/px · z∈[-40,+5]mm · 2 of 333 slices shown]
[im 111/333  lung]
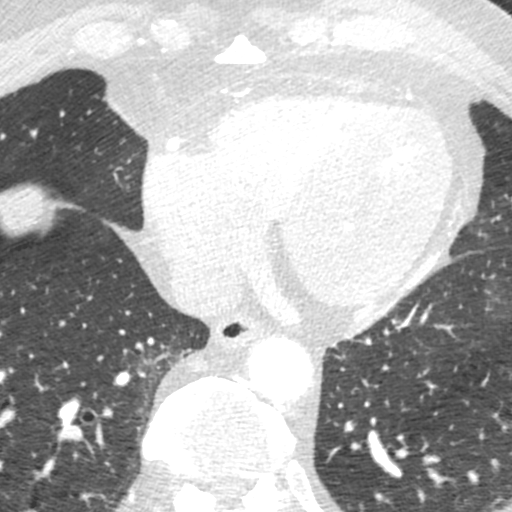
[im 222/333  lung]
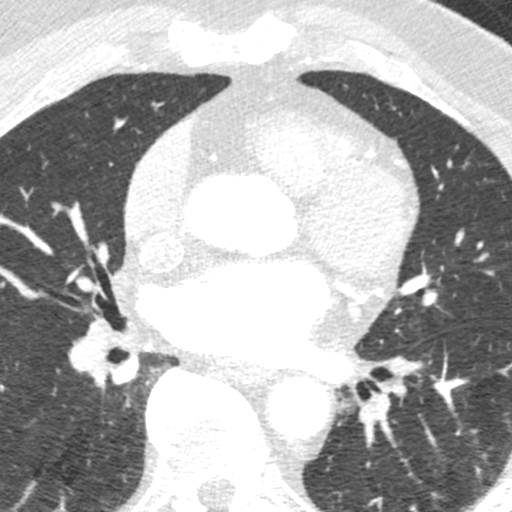

[8 of 20 positions shown; findings below may reference images not displayed]

FINDINGS: Vascular: None.

Mediastinum/Nodes: None.

Lungs/Pleura: Minimal bibasilar subpleural scarring. Calcified
granuloma in the left lower lobe. No pleural fluid.

Upper Abdomen: None.

Musculoskeletal: Degenerative changes in the spine.
IMPRESSION: No acute extracardiac findings.
FINDINGS: Quality:

Coronary calcium score: The patient's coronary artery calcium score
is 317, which places the patient in the 91st percentile.

Coronary arteries: Normal coronary origins.  Right dominance.

Right Coronary Artery: Dominant. Minimal mixed ostial 1-24% stenosis
(L9QI9QZC). Normal R-PDA and R-PLB branches.

Left Main Coronary Artery: Normal. Bifurcates into the LAD and LCx
arteries.

Left Anterior Descending Coronary Artery: Mild mixed proximal and
mid-vessel stenoses ([id number]). 2 diagonal branches without disease.

Left Circumflex Artery: AV groove vessel - minimal mixed 1-24%
proximal and mid-vessel stenoses ([id number]). Tortuous OM branch,
proximal artifact, no stenosis.

Aorta: Normal size, 30 mm at the mid ascending aorta (level of the
PA bifurcation) measured double oblique. No calcifications. No
dissection.

Aortic Valve: Trileaflet. No calcifications.

Other findings:

Normal pulmonary vein drainage into the left atrium.

Normal left atrial appendage without a thrombus.

Normal size of the pulmonary artery.
IMPRESSION: 1. Minimal to mild multivessel mixed non-obstructive CAD, CADRADS =
2.

2. Coronary calcium score of 317. This was 91st percentile for age
and sex matched control.

3. Normal coronary origin with right dominance.

4. Aggressive secondary cardiovascular risk factor modification is
recommended.

*** End of Addendum ***
EXAM:
OVER-READ INTERPRETATION  CT CHEST

The following report is an over-read performed by radiologist Dr.
Bamboom Bansal [place] [REDACTED] on 02/08/2021. This
over-read does not include interpretation of cardiac or coronary
anatomy or pathology. The coronary calcium score/coronary CTA
interpretation by the cardiologist is attached.
FINDINGS: Vascular: None.

Mediastinum/Nodes: None.

Lungs/Pleura: Minimal bibasilar subpleural scarring. Calcified
granuloma in the left lower lobe. No pleural fluid.

Upper Abdomen: None.

Musculoskeletal: Degenerative changes in the spine.
IMPRESSION: No acute extracardiac findings.

## 2023-11-04 ENCOUNTER — Ambulatory Visit: Admitting: Family Medicine

## 2023-11-13 ENCOUNTER — Ambulatory Visit: Admitting: Family Medicine

## 2023-11-13 ENCOUNTER — Encounter: Payer: Self-pay | Admitting: Family Medicine

## 2023-11-13 VITALS — BP 152/91 | HR 76 | Ht 68.0 in | Wt 209.0 lb

## 2023-11-13 DIAGNOSIS — G894 Chronic pain syndrome: Secondary | ICD-10-CM

## 2023-11-13 DIAGNOSIS — M25561 Pain in right knee: Secondary | ICD-10-CM | POA: Diagnosis not present

## 2023-11-13 DIAGNOSIS — Z96651 Presence of right artificial knee joint: Secondary | ICD-10-CM

## 2023-11-13 DIAGNOSIS — I10 Essential (primary) hypertension: Secondary | ICD-10-CM | POA: Diagnosis not present

## 2023-11-13 DIAGNOSIS — E785 Hyperlipidemia, unspecified: Secondary | ICD-10-CM

## 2023-11-13 DIAGNOSIS — M545 Low back pain, unspecified: Secondary | ICD-10-CM

## 2023-11-13 DIAGNOSIS — G4700 Insomnia, unspecified: Secondary | ICD-10-CM

## 2023-11-13 DIAGNOSIS — G8929 Other chronic pain: Secondary | ICD-10-CM

## 2023-11-13 DIAGNOSIS — Z7689 Persons encountering health services in other specified circumstances: Secondary | ICD-10-CM

## 2023-11-13 MED ORDER — TRAZODONE HCL 50 MG PO TABS: 25.0000 mg | ORAL_TABLET | Freq: Every evening | ORAL | 3 refills | Status: DC | PRN

## 2023-11-13 NOTE — Progress Notes (Unsigned)
 New Patient Office Visit  Subjective    Patient ID: John Weeks, male    DOB: 10-19-1955  Age: 68 y.o. MRN: [id number]  CC:  Chief Complaint  Patient presents with   Establish Care    HPI John Weeks presents to establish care ***  Outpatient Encounter Medications as of 11/13/2023  Medication Sig   allopurinol (ZYLOPRIM) 100 MG tablet Take 100 mg by mouth daily.   amLODipine (NORVASC) 10 MG tablet Take 10 mg by mouth daily.   atorvastatin (LIPITOR) 20 MG tablet Take 1 tablet by mouth daily.   azelastine (OPTIVAR) 0.05 % ophthalmic solution Place 1 drop into both eyes daily as needed for allergies.   budesonide-formoterol (SYMBICORT) 160-4.5 MCG/ACT inhaler Inhale 2 puffs into the lungs 2 (two) times daily.   cyclobenzaprine (FLEXERIL) 10 MG tablet Take 10 mg by mouth 3 (three) times daily as needed for muscle spasms.   fluticasone (FLONASE) 50 MCG/ACT nasal spray Place 2 sprays into both nostrils daily.   hydrochlorothiazide (HYDRODIURIL) 25 MG tablet Take 25 mg by mouth every morning.   ibuprofen (ADVIL) 800 MG tablet Take 1 tablet by mouth every 6 (six) hours as needed for pain.   linaclotide (LINZESS) 72 MCG capsule Take 72 mcg by mouth daily as needed for pain.   losartan (COZAAR) 100 MG tablet Take 100 mg by mouth daily.   naloxone (NARCAN) nasal spray 4 mg/0.1 mL Place 1 spray into the nose 3 (three) times daily as needed for opioid reversal.   omeprazole (PRILOSEC) 40 MG capsule Take 40 mg by mouth daily.   oxyCODONE  (ROXICODONE ) 15 MG immediate release tablet Take 15 mg by mouth every 6 (six) hours.   sertraline (ZOLOFT) 50 MG tablet Take 50 mg by mouth daily.   tamsulosin (FLOMAX) 0.4 MG CAPS capsule Take 0.4 mg by mouth daily.   ferrous sulfate 325 (65 FE) MG tablet Take 325 mg by mouth daily. (Patient not taking: Reported on 11/13/2023)   gabapentin (NEURONTIN) 300 MG capsule Take 300 mg by mouth 3 (three) times daily. (Patient not taking: Reported on 11/13/2023)    metoprolol  tartrate (LOPRESSOR ) 100 MG tablet Take 1 tablet (100 mg total) by mouth once for 1 dose. Take 2 hours prior to your CT if your heart rate is greater than 55 (Patient not taking: Reported on 11/13/2023)   nitroGLYCERIN  (NITROSTAT ) 0.4 MG SL tablet Place 1 tablet (0.4 mg total) under the tongue every 5 (five) minutes as needed. (Patient not taking: Reported on 11/13/2023)   oxyCODONE -acetaminophen  (PERCOCET) 10-325 MG tablet Take 1 tablet by mouth daily as needed for pain. (Patient not taking: Reported on 11/13/2023)   oxyCODONE -acetaminophen  (PERCOCET/ROXICET) 5-325 MG tablet Take 1 tablet by mouth every 6 (six) hours as needed for severe pain. (Patient not taking: Reported on 11/13/2023)   No facility-administered encounter medications on file as of 11/13/2023.    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[facility]) 06/21/2015   Bilateral primary osteoarthritis of knee 06/21/2015   Bilateral shoulder pain 10/06/2020   Carpal tunnel syndrome, right 10/06/2020   Chest pain with normal coronary angiography 02/18/2019   Chronic midline low back pain 11/04/2015   Chronic pain syndrome 06/21/2015   Contracture of ankle and foot joint 06/21/2015  DDD (degenerative disc disease), cervical 10/06/2020   DDD (degenerative disc disease), lumbar 06/21/2015   DISH (diffuse idiopathic skeletal hyperostosis) 09/14/2020   DOE (dyspnea on exertion) 08/22/2018   Dyslipidemia 06/21/2015   Esophageal perforation 08/24/2021   Essential hypertension 08/02/2015   Foreign body in esophagus 04/18/2017   Formatting of this note might be different from the original. Added automatically from request for surgery [id number]   GERD (gastroesophageal reflux disease) 08/24/2021   Gout  11/04/2015   History of total right knee replacement 11/04/2015   History of traumatic rupture of spleen 11/04/2015   Hyperlipemia    Hypertension    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) 09/19/2017   Low testosterone  08/02/2015   Lumbar radiculopathy 06/21/2015   Migraine without aura and without status migrainosus, not intractable 06/21/2015   Mild intermittent asthma without complication 11/04/2015   Mixed hyperlipidemia 08/02/2015   Neck pain 06/21/2015   Obesity (BMI 30.0-34.9) 01/24/2021   Opioid dependence (HCC) 08/24/2021   Osteoarthritis 06/21/2015   Osteoarthrosis, localized, secondary, ankle or foot 06/21/2015   Palpitations 08/22/2018   Peptic ulcer 06/21/2015   Presence of right artificial knee joint 09/19/2017   Primary hypertension 01/19/2021   Primary osteoarthritis of both hips 09/14/2020   Primary osteoarthritis of left ankle 11/04/2015   Primary osteoarthritis of left knee 09/19/2017   Restless leg syndrome 06/21/2015   Rotator cuff impingement syndrome, right 10/06/2020   S/P lumbar spinal fusion 01/11/2020   Tendinitis of left elbow 06/21/2015   Tremor 08/30/2021   Vision problem 06/21/2015   Vitamin D deficiency 08/02/2015    Past Surgical History:  Procedure Laterality Date   ANKLE SURGERY     left ankle   BACK SURGERY     x3   TOTAL KNEE ARTHROPLASTY     right knee     Family History  Problem Relation Age of Onset   Hypertension Mother    Hypertension Father    Diabetes Brother    Heart disease Neg Hx    Cancer Neg Hx     Social History   Socioeconomic History   Marital status: Divorced    Spouse name: Not on file   Number of children: Not on file   Years of education: Not on file   Highest education level: Not on file  Occupational History   Not on file  Tobacco Use   Smoking status: Never   Smokeless tobacco: Current    Types: Snuff  Substance and Sexual Activity   Alcohol  use: Yes    Comment: occasionally   Drug use: No   Sexual  activity: Not on file  Other Topics Concern   Not on file  Social History Narrative   Not on file   Social Drivers of Health   Financial Resource Strain: High Risk (08/24/2021)   Received from [facility]   Overall Financial Resource Strain (CARDIA)    Difficulty of Paying Living Expenses: Very hard  Food Insecurity: Low Risk  (03/10/2022)   Received from [facility]   Hunger Vital Sign    Within the past 12 months, you worried that your food would run out before you got money to buy more: Never true    Within the past 12 months, the food you bought just didn't last and you didn't have money to get more. : Never true  Transportation Needs: Not on file (03/10/2022)  Recent Concern: Transportation Needs - Unmet Transportation Needs (03/10/2022)   Received from [facility]  Transportation    In the past 12 months, has lack of reliable transportation kept you from medical appointments, meetings, work or from getting things needed for daily living? : Yes  Physical Activity: Not on file  Stress: No Stress Concern Present (08/24/2021)   Received from [facility] - Occupational Stress Questionnaire    Feeling of Stress : Not at all  Social Connections: Unknown (08/24/2021)   Received from [facility]   Social Network    Social Network: Not on file  Intimate Partner Violence: Unknown (08/24/2021)   Received from [facility]   HITS    Physically Hurt: Not on file    Insult or Talk Down To: Not on file    Threaten Physical Harm: Not on file    Scream or Curse: Not on file    ROS      Objective   BP (!) 152/91   Pulse 76   Ht 5' 8 (1.727 m)   Wt 209 lb (94.8 kg)   SpO2 94%   BMI 31.78 kg/m   Physical Exam  {Labs (Optional):23779}    Assessment & Plan:   Encounter to establish care     No follow-ups on file.   Tanda Raguel SQUIBB, MD

## 2023-11-14 ENCOUNTER — Encounter: Payer: Self-pay | Admitting: Family Medicine

## 2023-11-26 ENCOUNTER — Ambulatory Visit: Payer: Self-pay

## 2023-11-26 NOTE — Telephone Encounter (Signed)
 FYI Only or Action Required?: Action required by provider: referral request.  Patient was last seen in primary care on 11/13/2023 by Tanda Bleacher, MD.  Called Nurse Triage reporting Leg Pain.  Symptoms began several months ago.  Interventions attempted: Prescription medications: oxycodone  and Rest, hydration, or home remedies.  Symptoms are: stable.  Triage Disposition: See HCP Within 4 Hours (Or PCP Triage)-needing a phone call about a new pain management referral. Patient was told today that he couldn't be seen at the clinic he was referred to  Patient/caregiver understands and will follow disposition?: No, wishes to speak with PCP  Copied from CRM #[id number]. Topic: Clinical - Red Word Triage >> Nov 26, 2023  4:50 PM Tobias L wrote: Red Word that prompted transfer to Nurse Triage: leg pain is worsening, requesting sooner appointment. Reason for Disposition  [1] SEVERE pain (e.g., excruciating, unable to do any normal activities) AND [2] not improved after 2 hours of pain medicine  Answer Assessment - Initial Assessment Questions 1. ONSET: When did the pain start?      Chronic leg pain 2. LOCATION: Where is the pain located?      Right leg 3. PAIN: How bad is the pain?    (Scale 1-10; or mild, moderate, severe)     10-patient states this is his pain level every day 4. WORK OR EXERCISE: Has there been any recent work or exercise that involved this part of the body?      no 5. CAUSE: What do you think is causing the leg pain?     Chronic pain 6. OTHER SYMPTOMS: Do you have any other symptoms? (e.g., chest pain, back pain, breathing difficulty, swelling, rash, fever, numbness, weakness)     No other symptoms.  Patient states he went to the pain clinic today and was told he couldn't be seen. Patient endorses that he was seen at this particular pain clinic at one time sent by another PCP. Patient is asking for another referral for pain management. Patient is asking for a  phone call tomorrow.  Protocols used: Leg Pain-A-AH

## 2023-12-09 ENCOUNTER — Telehealth: Payer: Self-pay

## 2023-12-09 NOTE — Telephone Encounter (Addendum)
 Fax recived. Placed in provider's folder for review   Copied from CRM #[id number]. Topic: Clinical - Prescription Issue >> Dec 09, 2023 12:15 PM Fonda T wrote: Reason for CRM: Received call from patient, states per pharmacy request for eye drops has been requested via fax to office from pharmacy.  Patient states went to pick up medication for eye drops, and no response had been received from office.  Unable to send CRM for medication refill request as patient does not know the name of eye drops medication.  Patient requesting a follow up call regarding this.  Can be reached at [phone].  Pharmacy:  [facility] - [place], [place] - [address] [place] [address] Phone: [phone] Fax: [phone]    Thank you

## 2023-12-10 NOTE — Telephone Encounter (Signed)
 Prescription signed by Sula and sent back to [facility]   Copied from CRM [id number]. Topic: Clinical - Prescription Issue >> Dec 09, 2023 12:15 PM Fonda T wrote: Reason for CRM: Received call from patient, states per pharmacy request for eye drops has been requested via fax to office from pharmacy.  Patient states went to pick up medication for eye drops, and no response had been received from office.  Unable to send CRM for medication refill request as patient does not know the name of eye drops medication.  Patient requesting a follow up call regarding this.  Can be reached at [phone].  Pharmacy:  [facility] - [place], [place] - [address] [place] [address] Phone: [phone] Fax: [phone]    Thank you >> Dec 10, 2023  9:37 AM Treva T wrote: Reason for CRM: Received call from patient, checking status of medication for eye drops,  azelastine (OPTIVAR) 0.05 % ophthalmic solution.  Per chart review, fax was received, placed in providers folder for review.  Patient is inquiring when he can pick up medication, as he is out of medication, needs as soon as possible, today, as multiple attempts has been made requesting medication, since last week per patient.   Requesting a follow up call as soon as prescription has been refilled, can be reached at [phone].  [facility] - [place], [place] - [address] [place] [address] Phone: [phone] Fax: [phone]   Thank you

## 2023-12-17 ENCOUNTER — Encounter: Payer: Self-pay | Admitting: Family

## 2023-12-17 ENCOUNTER — Ambulatory Visit (INDEPENDENT_AMBULATORY_CARE_PROVIDER_SITE_OTHER): Admitting: Family

## 2023-12-17 VITALS — BP 165/79 | HR 76 | Temp 98.7°F | Resp 16 | Ht 68.0 in | Wt 207.4 lb

## 2023-12-17 DIAGNOSIS — G4700 Insomnia, unspecified: Secondary | ICD-10-CM | POA: Diagnosis not present

## 2023-12-17 DIAGNOSIS — E785 Hyperlipidemia, unspecified: Secondary | ICD-10-CM

## 2023-12-17 DIAGNOSIS — I10 Essential (primary) hypertension: Secondary | ICD-10-CM

## 2023-12-17 DIAGNOSIS — J4520 Mild intermittent asthma, uncomplicated: Secondary | ICD-10-CM | POA: Diagnosis not present

## 2023-12-17 MED ORDER — ATORVASTATIN CALCIUM 20 MG PO TABS: 20.0000 mg | ORAL_TABLET | Freq: Every day | ORAL | 0 refills | Status: DC

## 2023-12-17 MED ORDER — TRAZODONE HCL 100 MG PO TABS: 100.0000 mg | ORAL_TABLET | Freq: Every evening | ORAL | 0 refills | Status: DC | PRN

## 2023-12-17 MED ORDER — AMLODIPINE BESYLATE 10 MG PO TABS: 10.0000 mg | ORAL_TABLET | Freq: Every day | ORAL | 0 refills | Status: DC

## 2023-12-17 MED ORDER — HYDROCHLOROTHIAZIDE 50 MG PO TABS: 50.0000 mg | ORAL_TABLET | Freq: Every morning | ORAL | 0 refills | Status: AC

## 2023-12-17 MED ORDER — BUDESONIDE-FORMOTEROL FUMARATE 160-4.5 MCG/ACT IN AERO: 2.0000 | INHALATION_SPRAY | Freq: Two times a day (BID) | RESPIRATORY_TRACT | 2 refills | Status: AC

## 2023-12-17 MED ORDER — LOSARTAN POTASSIUM 100 MG PO TABS: 100.0000 mg | ORAL_TABLET | Freq: Every day | ORAL | 0 refills | Status: AC

## 2023-12-17 NOTE — Progress Notes (Signed)
 Patient ID: John Weeks, male    DOB: 07/14/1955  MRN: [id number]  CC: Chronic Conditions Follow-Up  Subjective: John Weeks is a 68 y.o. male who presents for chronic conditions follow-up.   His concerns today include:  - Doing well on Amlodipine , Hydrochlorothiazide , and Losartan , no issues/concerns. He does not complain of red flag symptoms such as but not limited to chest pain, shortness of breath, worst headache of life, nausea/vomiting.  - Doing well on Atorvastatin , no issues/concerns.  - States he doesn't feel Trazodone  helping as much as he would like.  - States needs refills of Symbicort , no issues/concerns.  Patient Active Problem List   Diagnosis Date Noted   Tremor 08/30/2021   Esophageal perforation 08/24/2021   Opioid dependence (HCC) 08/24/2021   GERD (gastroesophageal reflux disease) 08/24/2021   Angina pectoris (HCC) 01/24/2021   Obesity (BMI 30.0-34.9) 01/24/2021   Hyperlipemia 01/19/2021   Primary hypertension 01/19/2021   Bilateral shoulder pain 10/06/2020   Carpal tunnel syndrome, right 10/06/2020   DDD (degenerative disc disease), cervical 10/06/2020   Rotator cuff impingement syndrome, right 10/06/2020   DISH (diffuse idiopathic skeletal hyperostosis) 09/14/2020   Primary osteoarthritis of both hips 09/14/2020   S/P lumbar spinal fusion 01/11/2020   Chest pain with normal coronary angiography 02/18/2019   Abnormal myocardial perfusion study 09/29/2018   DOE (dyspnea on exertion) 08/22/2018   Palpitations 08/22/2018   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) 09/19/2017   Presence of right artificial knee joint 09/19/2017   Primary osteoarthritis of left knee 09/19/2017   Foreign body in esophagus 04/18/2017   Chronic midline low back pain 11/04/2015   Gout 11/04/2015   History of total right knee replacement 11/04/2015   History of traumatic rupture of spleen 11/04/2015   Mild intermittent asthma without complication 11/04/2015    Primary osteoarthritis of left ankle 11/04/2015   Acquired hypothyroidism 08/02/2015   Essential hypertension 08/02/2015   Low testosterone  08/02/2015   Mixed hyperlipidemia 08/02/2015   Vitamin D deficiency 08/02/2015   Allergic conjunctivitis of both eyes 06/21/2015   Allergic rhinitis 06/21/2015   Bilateral primary osteoarthritis of knee 06/21/2015   Chronic pain syndrome 06/21/2015   Contracture of ankle and foot joint 06/21/2015   DDD (degenerative disc disease), lumbar 06/21/2015   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     azelastine (OPTIVAR) 0.05 % ophthalmic solution Place 1 drop into both eyes daily as needed for allergies.     cyclobenzaprine (FLEXERIL) 10 MG tablet Take 10 mg by mouth 3 (three) times daily as needed for muscle spasms.     fluticasone (FLONASE) 50 MCG/ACT nasal spray Place 2 sprays into both nostrils daily.     ibuprofen (ADVIL) 800 MG tablet Take 1 tablet by mouth every 6 (six) hours as needed for pain.     linaclotide (LINZESS) 72 MCG capsule Take 72 mcg by mouth daily as needed for pain.  naloxone (NARCAN) nasal spray 4 mg/0.1 mL Place 1 spray into the nose 3 (three) times daily as needed for opioid reversal.     omeprazole (PRILOSEC) 40 MG capsule Take 40 mg by mouth daily.     oxyCODONE  (ROXICODONE ) 15 MG immediate release tablet Take 15 mg by mouth every 6 (six) hours.     sertraline  (ZOLOFT) 50 MG tablet Take 50 mg by mouth daily.     tamsulosin (FLOMAX) 0.4 MG CAPS capsule Take 0.4 mg by mouth daily.     ferrous sulfate 325 (65 FE) MG tablet Take 325 mg by mouth daily. (Patient not taking: Reported on 11/13/2023)     gabapentin (NEURONTIN) 300 MG capsule Take 300 mg by mouth 3 (three) times daily. (Patient not taking: Reported on 11/13/2023)     metoprolol  tartrate (LOPRESSOR ) 100 MG tablet Take 1 tablet (100 mg total) by mouth once for 1 dose. Take 2 hours prior to your CT if your heart rate is greater than 55 (Patient not taking: Reported on 11/13/2023) 1 tablet 0   nitroGLYCERIN  (NITROSTAT ) 0.4 MG SL tablet Place 1 tablet (0.4 mg total) under the tongue every 5 (five) minutes as needed. (Patient not taking: Reported on 11/13/2023) 25 tablet 6   oxyCODONE -acetaminophen  (PERCOCET) 10-325 MG tablet Take 1 tablet by mouth daily as needed for pain. (Patient not taking: Reported on 11/13/2023)     oxyCODONE -acetaminophen  (PERCOCET/ROXICET) 5-325 MG tablet Take 1 tablet by mouth every 6 (six) hours as needed for severe pain. (Patient not taking: Reported on 11/13/2023) 15 tablet 0   No current facility-administered medications on file prior to visit.    Allergies  Allergen Reactions   Contrast Media [Iodinated Contrast Media] Anaphylaxis    Angioedema, Shortness of Breath (After IV contrast given on 02/08/2021)   Ketorolac  Anaphylaxis   Gabapentin     Other reaction(s): Other (See Comments) nightmares    Social History   Socioeconomic History   Marital status: Divorced    Spouse name: Not on file   Number of children: Not on file   Years of education: Not on file   Highest education level: Not on file  Occupational History   Not on file  Tobacco Use   Smoking status: Never   Smokeless tobacco: Current    Types: Snuff  Substance and Sexual Activity   Alcohol  use: Yes    Comment: occasionally   Drug use: No   Sexual activity: Not on file  Other Topics Concern    Not on file  Social History Narrative   Not on file   Social Drivers of Health   Financial Resource Strain: Low Risk  (12/17/2023)   Overall Financial Resource Strain (CARDIA)    Difficulty of Paying Living Expenses: Not very hard  Food Insecurity: Food Insecurity Present (12/17/2023)   Hunger Vital Sign    Worried About Running Out of Food in the Last Year: Sometimes true    Ran Out of Food in the Last Year: Sometimes true  Transportation Needs: No Transportation Needs (12/17/2023)   PRAPARE - Administrator, Civil Service (Medical): No    Lack of Transportation (Non-Medical): No  Physical Activity: Unknown (12/17/2023)   Exercise Vital Sign    Days of Exercise per Week: Not on file    Minutes of Exercise per Session: 30 min  Stress: No Stress Concern Present (12/17/2023)   [employer] of Occupational Health - Occupational Stress Questionnaire    Feeling of Stress: Only  a little  Social Connections: Moderately Isolated (12/17/2023)   Social Connection and Isolation Panel    Frequency of Communication with Friends and Family: More than three times a week    Frequency of Social Gatherings with Friends and Family: More than three times a week    Attends Religious Services: 1 to 4 times per year    Active Member of [employer] or Organizations: No    Attends Banker Meetings: Never    Marital Status: Separated  Intimate Partner Violence: Not At Risk (12/17/2023)   Humiliation, Afraid, Rape, and Kick questionnaire    Fear of Current or Ex-Partner: No    Emotionally Abused: No    Physically Abused: No    Sexually Abused: No    Family History  Problem Relation Age of Onset   Hypertension Mother    Hypertension Father    Diabetes Brother    Heart disease Neg Hx    Cancer Neg Hx     Past Surgical History:  Procedure Laterality Date   ANKLE SURGERY     left ankle   BACK SURGERY     x3   TOTAL KNEE ARTHROPLASTY     right knee     ROS: Review of  Systems Negative except as stated above  PHYSICAL EXAM: BP (!) 165/79   Pulse 76   Temp 98.7 F (37.1 C) (Oral)   Resp 16   Ht 5' 8 (1.727 m)   Wt 207 lb 6.4 oz (94.1 kg)   SpO2 96%   BMI 31.54 kg/m   Physical Exam HENT:     Head: Normocephalic and atraumatic.     Nose: Nose normal.     Mouth/Throat:     Mouth: Mucous membranes are moist.     Pharynx: Oropharynx is clear.  Eyes:     Extraocular Movements: Extraocular movements intact.     Conjunctiva/sclera: Conjunctivae normal.     Pupils: Pupils are equal, round, and reactive to light.  Cardiovascular:     Rate and Rhythm: Normal rate and regular rhythm.     Pulses: Normal pulses.     Heart sounds: Normal heart sounds.  Pulmonary:     Effort: Pulmonary effort is normal.     Breath sounds: Normal breath sounds.  Musculoskeletal:        General: Normal range of motion.     Cervical back: Normal range of motion and neck supple.  Neurological:     General: No focal deficit present.     Mental Status: He is alert and oriented to person, place, and time.  Psychiatric:        Mood and Affect: Mood normal.        Behavior: Behavior normal.     ASSESSMENT AND PLAN: 1. Primary hypertension (Primary) - Blood pressure not at goal during today's visit. Patient asymptomatic without chest pressure, chest pain, palpitations, shortness of breath, worst headache of life, and any additional red flag symptoms. - Continue Amlodipine  and Losartan  as prescribed.  - Increase Hydrochlorothiazide  from 25 mg to 50 mg as prescribed.  - Routine screening.  - Counseled on blood pressure goal of less than 130/80, low-sodium, DASH diet, medication compliance, and 150 minutes of moderate intensity exercise per week as tolerated. Counseled on medication adherence and adverse effects. - Follow-up with primary provider in 4 weeks or sooner if needed.  - amLODipine  (NORVASC ) 10 MG tablet; Take 1 tablet (10 mg total) by mouth daily.  Dispense: 90  tablet;  Refill: 0 - hydrochlorothiazide  (HYDRODIURIL ) 50 MG tablet; Take 1 tablet (50 mg total) by mouth every morning.  Dispense: 90 tablet; Refill: 0 - losartan  (COZAAR ) 100 MG tablet; Take 1 tablet (100 mg total) by mouth daily.  Dispense: 90 tablet; Refill: 0 - Basic Metabolic Panel  2. Hyperlipidemia, unspecified hyperlipidemia type - Continue Atorvastatin  as prescribed. Counseled on medication adherence/adverse effects.  - Routine screening.  - Follow-up with primary provider as scheduled. - atorvastatin  (LIPITOR) 20 MG tablet; Take 1 tablet (20 mg total) by mouth daily.  Dispense: 90 tablet; Refill: 0 - Lipid panel  3. Insomnia, unspecified type - Increase Trazodone  from 25-50 mg to 100 mg as prescribed. Counseled on medication adherence/adverse effects.  - Follow-up with primary provider in 4 weeks or sooner if needed. - traZODone  (DESYREL ) 100 MG tablet; Take 1 tablet (100 mg total) by mouth at bedtime as needed for sleep.  Dispense: 90 tablet; Refill: 0  4. Mild intermittent asthma without complication - Continue Budesonide -Formoterol  as prescribed. Counseled on medication adherence/adverse effects.  - Follow-up with primary provider as scheduled. - budesonide -formoterol  (SYMBICORT ) 160-4.5 MCG/ACT inhaler; Inhale 2 puffs into the lungs 2 (two) times daily.  Dispense: 10.2 g; Refill: 2    Patient was given the opportunity to ask questions.  Patient verbalized understanding of the plan and was able to repeat key elements of the plan. Patient was given clear instructions to go to Emergency Department or return to medical center if symptoms don't improve, worsen, or new problems develop.The patient verbalized understanding.   Orders Placed This Encounter  Procedures   Basic Metabolic Panel   Lipid panel     Requested Prescriptions   Signed Prescriptions Disp Refills   amLODipine  (NORVASC ) 10 MG tablet 90 tablet 0    Sig: Take 1 tablet (10 mg total) by mouth daily.    atorvastatin  (LIPITOR) 20 MG tablet 90 tablet 0    Sig: Take 1 tablet (20 mg total) by mouth daily.   budesonide -formoterol  (SYMBICORT ) 160-4.5 MCG/ACT inhaler 10.2 g 2    Sig: Inhale 2 puffs into the lungs 2 (two) times daily.   hydrochlorothiazide  (HYDRODIURIL ) 50 MG tablet 90 tablet 0    Sig: Take 1 tablet (50 mg total) by mouth every morning.   losartan  (COZAAR ) 100 MG tablet 90 tablet 0    Sig: Take 1 tablet (100 mg total) by mouth daily.   traZODone  (DESYREL ) 100 MG tablet 90 tablet 0    Sig: Take 1 tablet (100 mg total) by mouth at bedtime as needed for sleep.    Return in 4 weeks (on 01/14/2024) for Follow-Up or next available with Raguel Blush, MD.  Greig JINNY Drones, NP

## 2023-12-17 NOTE — Progress Notes (Signed)
 One follow up

## 2023-12-18 ENCOUNTER — Ambulatory Visit: Payer: Self-pay | Admitting: Family

## 2023-12-18 LAB — BASIC METABOLIC PANEL WITH GFR
BUN/Creatinine Ratio: 14 (ref 10–24)
BUN: 12 mg/dL (ref 8–27)
CO2: 18 mmol/L — ABNORMAL LOW (ref 20–29)
Calcium: 9.5 mg/dL (ref 8.6–10.2)
Chloride: 106 mmol/L (ref 96–106)
Creatinine, Ser: 0.85 mg/dL (ref 0.76–1.27)
Glucose: 93 mg/dL (ref 70–99)
Potassium: 3 mmol/L — ABNORMAL LOW (ref 3.5–5.2)
Sodium: 143 mmol/L (ref 134–144)
eGFR: 96 mL/min/1.73 (ref >59)

## 2023-12-18 LAB — LIPID PANEL
Chol/HDL Ratio: 2.8 ratio (ref 0.0–5.0)
Cholesterol, Total: 115 mg/dL (ref 100–199)
HDL: 41 mg/dL (ref >39)
LDL Chol Calc (NIH): 41 mg/dL (ref 0–99)
Triglycerides: 204 mg/dL — ABNORMAL HIGH (ref 0–149)
VLDL Cholesterol Cal: 33 mg/dL (ref 5–40)

## 2024-02-17 ENCOUNTER — Ambulatory Visit

## 2024-02-17 VITALS — Ht 68.0 in | Wt 207.0 lb

## 2024-02-17 DIAGNOSIS — Z0000 Encounter for general adult medical examination without abnormal findings: Secondary | ICD-10-CM

## 2024-02-17 NOTE — Patient Instructions (Addendum)
 Mr. Ballweg,  Thank you for taking the time for your Medicare Wellness Visit. I appreciate your continued commitment to your health goals. Please review the care plan we discussed, and feel free to reach out if I can assist you further.  Medicare recommends these wellness visits once per year to help you and your care team stay ahead of potential health issues. These visits are designed to focus on prevention, allowing your provider to concentrate on managing your acute and chronic conditions during your regular appointments.  Please note that Annual Wellness Visits do not include a physical exam. Some assessments may be limited, especially if the visit was conducted virtually. If needed, we may recommend a separate in-person follow-up with your provider.  Ongoing Care Seeing your primary care provider every 3 to 6 months helps us  monitor your health and provide consistent, personalized care. Last office visit on 12/17/2023.  I have sent a referral for you to Select the program [facility] [employer] [facility] by  [facility] They will send you a text message to you with more information about   Referrals If a referral was made during today's visit and you haven't received any updates within two weeks, please contact the referred provider directly to check on the status.  Recommended Screenings:  Health Maintenance  Topic Date Due   Medicare Annual Wellness Visit  Never done   Hepatitis C Screening  Never done   Colon Cancer Screening  Never done   Flu Shot  Never done   COVID-19 Vaccine (1 - 2025-26 season) Never done   DTaP/Tdap/Td vaccine (1 - Tdap) 12/16/2024*   Pneumococcal Vaccine for age over 27 (1 of 2 - PCV) 12/16/2024*   Zoster (Shingles) Vaccine (1 of 2) 03/18/2025*   Meningitis B Vaccine  Aged Out  *Topic was postponed. The date shown is not the original due date.       02/17/2024    1:17 PM  Advanced Directives  Does Patient Have a Medical Advance Directive? No    Advance Care Planning is important because it: Ensures you receive medical care that aligns with your values, goals, and preferences. Provides guidance to your family and loved ones, reducing the emotional burden of decision-making during critical moments.  Vision: Annual vision screenings are recommended for early detection of glaucoma, cataracts, and diabetic retinopathy. These exams can also reveal signs of chronic conditions such as diabetes and high blood pressure.  Dental: Annual dental screenings help detect early signs of oral cancer, gum disease, and other conditions linked to overall health, including heart disease and diabetes.  Please see the attached documents for additional preventive care recommendations.

## 2024-02-17 NOTE — Progress Notes (Signed)
 Subjective:   John Weeks is a 68 y.o. who presents for a Medicare Wellness preventive visit.  As a reminder, Annual Wellness Visits don't include a physical exam, and some assessments may be limited, especially if this visit is performed virtually. We may recommend an in-person follow-up visit with your provider if needed.  Visit Complete: Virtual I connected with  John Weeks on 02/17/24 by a audio enabled telemedicine application and verified that I am speaking with the correct person using two identifiers.  Patient Location: Home  Provider Location: Home Office  I discussed the limitations of evaluation and management by telemedicine. The patient expressed understanding and agreed to proceed.  Vital Signs: Because this visit was a virtual/telehealth visit, some criteria may be missing or patient reported. Any vitals not documented were not able to be obtained and vitals that have been documented are patient reported.  VideoError- Librarian, academic were attempted between this provider and patient, however failed, due to patient having technical difficulties OR patient did not have access to video capability.  We continued and completed visit with audio only.   Persons Participating in Visit: Patient.  AWV Questionnaire: No: Patient Medicare AWV questionnaire was not completed prior to this visit.  Cardiac Risk Factors include: advanced age (>45men, >80 women);male gender;dyslipidemia     Objective:    Today's Vitals   02/17/24 1308  Weight: 207 lb (93.9 kg)  PainSc: 10-Worst pain ever   Body mass index is 31.47 kg/m.     02/17/2024    1:17 PM 02/08/2021   11:56 AM  Advanced Directives  Does Patient Have a Medical Advance Directive? No No    Current Medications (verified) Outpatient Encounter Medications as of 02/17/2024  Medication Sig   allopurinol (ZYLOPRIM) 100 MG tablet Take 100 mg by mouth daily.   amLODipine  (NORVASC ) 10  MG tablet Take 1 tablet (10 mg total) by mouth daily.   atorvastatin  (LIPITOR) 20 MG tablet Take 1 tablet (20 mg total) by mouth daily.   azelastine (OPTIVAR) 0.05 % ophthalmic solution Place 1 drop into both eyes daily as needed for allergies.   budesonide -formoterol  (SYMBICORT ) 160-4.5 MCG/ACT inhaler Inhale 2 puffs into the lungs 2 (two) times daily.   cyclobenzaprine (FLEXERIL) 10 MG tablet Take 10 mg by mouth 3 (three) times daily as needed for muscle spasms.   fluticasone (FLONASE) 50 MCG/ACT nasal spray Place 2 sprays into both nostrils daily.   hydrochlorothiazide  (HYDRODIURIL ) 50 MG tablet Take 1 tablet (50 mg total) by mouth every morning.   ibuprofen (ADVIL) 800 MG tablet Take 1 tablet by mouth every 6 (six) hours as needed for pain.   linaclotide (LINZESS) 72 MCG capsule Take 72 mcg by mouth daily as needed for pain.   losartan  (COZAAR ) 100 MG tablet Take 1 tablet (100 mg total) by mouth daily.   naloxone (NARCAN) nasal spray 4 mg/0.1 mL Place 1 spray into the nose 3 (three) times daily as needed for opioid reversal.   omeprazole (PRILOSEC) 40 MG capsule Take 40 mg by mouth daily.   oxyCODONE  (ROXICODONE ) 15 MG immediate release tablet Take 15 mg by mouth every 6 (six) hours. (Patient taking differently: Take 20 mg by mouth every 6 (six) hours.)   sertraline (ZOLOFT) 50 MG tablet Take 50 mg by mouth daily.   tamsulosin (FLOMAX) 0.4 MG CAPS capsule Take 0.4 mg by mouth daily.   traZODone  (DESYREL ) 100 MG tablet Take 1 tablet (100 mg total) by mouth at  bedtime as needed for sleep.   ferrous sulfate 325 (65 FE) MG tablet Take 325 mg by mouth daily. (Patient not taking: Reported on 02/17/2024)   gabapentin (NEURONTIN) 300 MG capsule Take 300 mg by mouth 3 (three) times daily. (Patient not taking: Reported on 02/17/2024)   metoprolol  tartrate (LOPRESSOR ) 100 MG tablet Take 1 tablet (100 mg total) by mouth once for 1 dose. Take 2 hours prior to your CT if your heart rate is greater than 55  (Patient not taking: Reported on 02/17/2024)   nitroGLYCERIN  (NITROSTAT ) 0.4 MG SL tablet Place 1 tablet (0.4 mg total) under the tongue every 5 (five) minutes as needed. (Patient not taking: Reported on 02/17/2024)   oxyCODONE -acetaminophen  (PERCOCET) 10-325 MG tablet Take 1 tablet by mouth daily as needed for pain. (Patient not taking: Reported on 02/17/2024)   oxyCODONE -acetaminophen  (PERCOCET/ROXICET) 5-325 MG tablet Take 1 tablet by mouth every 6 (six) hours as needed for severe pain. (Patient not taking: Reported on 02/17/2024)   No facility-administered encounter medications on file as of 02/17/2024.    Allergies (verified) Contrast media [iodinated contrast media], Ketorolac , and Gabapentin   History: Past Medical History:  Diagnosis Date   Abnormal myocardial perfusion study 09/29/2018   Acquired hypothyroidism 08/02/2015   Allergic conjunctivitis of both eyes 06/21/2015   Allergic rhinitis 06/21/2015   Angina pectoris 01/24/2021   Articular bearing surface wear of prosthetic knee 06/21/2015   Bilateral primary osteoarthritis of knee 06/21/2015   Bilateral shoulder pain 10/06/2020   Carpal tunnel syndrome, right 10/06/2020   Chest pain with normal coronary angiography 02/18/2019   Chronic midline low back pain 11/04/2015   Chronic pain syndrome 06/21/2015   Contracture of ankle and foot joint 06/21/2015   DDD (degenerative disc disease), cervical 10/06/2020   DDD (degenerative disc disease), lumbar 06/21/2015   DISH (diffuse idiopathic skeletal hyperostosis) 09/14/2020   DOE (dyspnea on exertion) 08/22/2018   Dyslipidemia 06/21/2015   Esophageal perforation 08/24/2021   Essential hypertension 08/02/2015   Foreign body in esophagus 04/18/2017   Formatting of this note might be different from the original. 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   Low testosterone  08/02/2015   Lumbar radiculopathy 06/21/2015   Migraine without aura and without status migrainosus, not intractable 06/21/2015   Mild intermittent asthma without complication 11/04/2015   Mixed hyperlipidemia 08/02/2015   Neck pain 06/21/2015   Obesity (BMI 30.0-34.9) 01/24/2021   Opioid dependence (HCC) 08/24/2021   Osteoarthritis 06/21/2015   Osteoarthrosis, localized, secondary, ankle or foot 06/21/2015   Palpitations 08/22/2018   Peptic ulcer 06/21/2015   Presence of right artificial knee joint 09/19/2017   Primary hypertension 01/19/2021   Primary osteoarthritis of both hips 09/14/2020   Primary osteoarthritis of left ankle 11/04/2015   Primary osteoarthritis of left knee 09/19/2017   Restless leg syndrome 06/21/2015   Rotator cuff impingement syndrome, right 10/06/2020   S/P lumbar spinal fusion 01/11/2020   Tendinitis of left elbow 06/21/2015   Tremor 08/30/2021   Vision problem 06/21/2015   Vitamin D deficiency 08/02/2015   Past Surgical History:  Procedure Laterality Date   ANKLE SURGERY     left ankle   BACK SURGERY     x3   TOTAL KNEE  ARTHROPLASTY     right knee    Family History  Problem Relation Age of Onset   Hypertension Mother    Hypertension Father    Diabetes Brother    Heart disease Neg Hx    Cancer Neg Hx    Social History   Socioeconomic History   Marital status: Divorced    Spouse name: Not on file   Number of children: Not on file   Years of education: Not on file   Highest education level: Not on file  Occupational History   Occupation: RETIRED  Tobacco Use   Smoking status: Never   Smokeless tobacco: Current    Types: Snuff  Vaping Use   Vaping status: Never Used  Substance and Sexual Activity   Alcohol  use: Yes    Comment: occasionally   Drug use: No   Sexual activity: Not on file  Other Topics Concern    Not on file  Social History Narrative   Lives alone/2025   Social Drivers of Health   Financial Resource Strain: High Risk (02/17/2024)   Overall Financial Resource Strain (CARDIA)    Difficulty of Paying Living Expenses: Very hard  Food Insecurity: Food Insecurity Present (02/17/2024)   Hunger Vital Sign    Worried About Running Out of Food in the Last Year: Sometimes true    Ran Out of Food in the Last Year: Sometimes true  Transportation Needs: No Transportation Needs (02/17/2024)   PRAPARE - Administrator, Civil Service (Medical): No    Lack of Transportation (Non-Medical): No  Physical Activity: Inactive (02/17/2024)   Exercise Vital Sign    Days of Exercise per Week: 0 days    Minutes of Exercise per Session: 0 min  Stress: Stress Concern Present (02/17/2024)   [employer] of Occupational Health - Occupational Stress Questionnaire    Feeling of Stress: Very much  Social Connections: Socially Isolated (02/17/2024)   Social Connection and Isolation Panel    Frequency of Communication with Friends and Family: More than three times a week    Frequency of Social Gatherings with Friends and Family: Once a week    Attends Religious Services: Never    Database Administrator or Organizations: No    Attends Engineer, Structural: Never    Marital Status: Separated    Tobacco Counseling Ready to quit: Not Answered Counseling given: Not Answered    Clinical Intake:  Pre-visit preparation completed: Yes  Pain : 0-10 Pain Score: 10-Worst pain ever Pain Type: Chronic pain (Osteoarthritis) Pain Descriptors / Indicators: Aching, Discomfort Pain Onset: More than a month ago Pain Frequency: Constant Pain Relieving Factors: oxyCODONE  (ROXICODONE ) 20 MG immediate release tablet  Pain Relieving Factors: oxyCODONE  (ROXICODONE ) 20 MG immediate release tablet  Nutritional Risks: Nausea/ vomitting/ diarrhea  No results found for: HGBA1C   How often do  you need to have someone help you when you read instructions, pamphlets, or other written materials from your doctor or pharmacy?: 1 - Never  Interpreter Needed?: No  Information entered by :: John Weeks, RMA  Activities of Daily Living     02/17/2024    1:07 PM  In your present state of health, do you have any difficulty performing the following activities:  Hearing? 0  Vision? 0  Difficulty concentrating or making decisions? 0  Walking or climbing stairs? 0  Dressing or bathing? 0  Doing errands, shopping? 0  Preparing Food and eating ? N  Using the Toilet? N  In the past six months, have you accidently leaked urine? N  Do you have problems with loss of bowel control? N  Managing your Medications? N  Managing your Finances? N  Housekeeping or managing your Housekeeping? N    Patient Care Team: John Bleacher, MD as PCP - General (Family Medicine)  I have updated your Care Teams any recent Medical Services you may have received from other providers in the past year.     Assessment:   This is a routine wellness examination for John Weeks.  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 1 0  Number falls in past yr: 0 0  Injury with Fall? 0   Risk for fall due to :  No Fall Risks  Follow up Falls evaluation completed;Falls prevention discussed Falls evaluation completed    MEDICARE RISK AT HOME:  Medicare Risk at Home Any stairs in or around the home?: Yes If so, are there any without  handrails?: No Home free of loose throw rugs in walkways, pet beds, electrical cords, etc?: Yes Adequate lighting in your home to reduce risk of falls?: Yes Life alert?: No Use of a cane, walker or w/c?: Yes (cane and walker) Grab bars in the bathroom?: No Shower chair or bench in shower?: No Elevated toilet seat or a handicapped toilet?: No  TIMED UP AND GO:  Was the test performed?  No  Cognitive Function: Declined/Normal: No cognitive concerns noted by patient or family. Patient alert, oriented, able to answer questions appropriately and recall recent events. No signs of memory loss or confusion.        Immunizations  There is no immunization history on file for this patient.  Screening Tests Health Maintenance  Topic Date Due   Medicare Annual Wellness (AWV)  Never done   Hepatitis C Screening  Never done   Colonoscopy  Never done   Influenza Vaccine  Never done   COVID-19 Vaccine (1 - 2025-26 season) Never done   DTaP/Tdap/Td (1 - Tdap) 12/16/2024 (Originally 06/25/1974)   Pneumococcal Vaccine: 50+ Years (1 of 2 - PCV) 12/16/2024 (Originally 06/25/1974)   Zoster Vaccines- Shingrix (1 of 2) 03/18/2025 (Originally 06/26/2007)   Meningococcal B Vaccine  Aged Out    Health Maintenance Items Addressed: See Nurse Notes at the end of this note  Additional Screening:  Vision Screening: Recommended annual ophthalmology exams for early detection of glaucoma and other disorders of the eye. Is the patient up to date with their annual eye exam?  No  Who is the provider or what is the name of the office in which the patient attends annual eye exams? No eye care provider.  Dental Screening: Recommended annual dental exams for proper oral hygiene  Community Resource Referral /  Chronic Care Management: CRR required this visit?  Yes   CCM required this visit?  No   Plan:    I have personally reviewed and noted the following in the patient's chart:   Medical and social  history Use of alcohol , tobacco or illicit drugs  Current medications and supplements including opioid prescriptions. Patient is currently taking opioid prescriptions. Information provided to patient regarding non-opioid alternatives. Patient advised to discuss non-opioid treatment plan with their provider. Functional ability and status Nutritional status Physical activity Advanced directives List of other physicians Hospitalizations, surgeries, and ER visits in previous 12 months Vitals Screenings to include cognitive, depression, and falls Referrals and appointments  In addition, I have reviewed and discussed with patient certain preventive protocols, quality metrics, and best practice recommendations. A written personalized care plan for preventive services as well as general preventive health recommendations were provided to patient.   John Weeks, CMA   02/17/2024   After Visit Summary: (MyChart) Due to this being a telephonic visit, the after visit summary with patients personalized plan was offered to patient via MyChart   Notes: Patient declines any due vaccines.  He stated that he has had a colonoscopy and that he is up to date with it.  Patient stated that he had it done in at [facility].  I informed patient is get the records sent over to Dr. Luigi office.  Patient also had a concerns about a minor cold with body aches and fever, that he had been trying to fight off.  Patient did not have a tempeture to give me today.  I informed him to call the office to get scheduled to be seen.

## 2024-02-26 ENCOUNTER — Other Ambulatory Visit: Payer: Self-pay | Admitting: Family Medicine

## 2024-02-26 DIAGNOSIS — E785 Hyperlipidemia, unspecified: Secondary | ICD-10-CM

## 2024-02-26 DIAGNOSIS — I10 Essential (primary) hypertension: Secondary | ICD-10-CM

## 2024-03-31 ENCOUNTER — Other Ambulatory Visit: Payer: Self-pay | Admitting: Family

## 2024-03-31 DIAGNOSIS — G4700 Insomnia, unspecified: Secondary | ICD-10-CM

## 2024-04-03 ENCOUNTER — Other Ambulatory Visit: Payer: Self-pay | Admitting: Family Medicine
